# Patient Record
Sex: Female | Born: 1955 | Race: White | Hispanic: No | Marital: Single | Smoking: Never smoker
Health system: Southern US, Community
[De-identification: ages and names within clinical notes are randomized; demographics above are authoritative.]

## PROBLEM LIST (undated history)

## (undated) DIAGNOSIS — E785 Hyperlipidemia, unspecified: Secondary | ICD-10-CM

## (undated) DIAGNOSIS — M0579 Rheumatoid arthritis with rheumatoid factor of multiple sites without organ or systems involvement: Secondary | ICD-10-CM

## (undated) DIAGNOSIS — F419 Anxiety disorder, unspecified: Secondary | ICD-10-CM

## (undated) DIAGNOSIS — M8580 Other specified disorders of bone density and structure, unspecified site: Secondary | ICD-10-CM

## (undated) DIAGNOSIS — M169 Osteoarthritis of hip, unspecified: Secondary | ICD-10-CM

## (undated) HISTORY — DX: Anxiety disorder, unspecified: F41.9

## (undated) HISTORY — DX: Rheumatoid arthritis with rheumatoid factor of multiple sites without organ or systems involvement: M05.79

## (undated) HISTORY — DX: Osteoarthritis of hip, unspecified: M16.9

## (undated) HISTORY — DX: Other specified disorders of bone density and structure, unspecified site: M85.80

## (undated) HISTORY — DX: Hyperlipidemia, unspecified: E78.5

---

## 2010-06-24 LAB — HM COLONOSCOPY

## 2015-09-05 LAB — HM PAP SMEAR: HM Pap smear: NEGATIVE

## 2016-03-27 ENCOUNTER — Ambulatory Visit (INDEPENDENT_AMBULATORY_CARE_PROVIDER_SITE_OTHER): Payer: BLUE CROSS/BLUE SHIELD | Admitting: Physical Therapy

## 2016-03-27 ENCOUNTER — Encounter: Payer: Self-pay | Admitting: Physical Therapy

## 2016-03-27 DIAGNOSIS — R252 Cramp and spasm: Secondary | ICD-10-CM

## 2016-03-27 DIAGNOSIS — M6281 Muscle weakness (generalized): Secondary | ICD-10-CM | POA: Diagnosis not present

## 2016-03-27 DIAGNOSIS — M25551 Pain in right hip: Secondary | ICD-10-CM

## 2016-03-27 NOTE — Therapy (Signed)
[facility] [place] [address] [place], [place], [address] Phone: [phone]   Fax:  [phone]  Physical Therapy Evaluation  Patient Details  Name: Stacy Reed MRN: [id number] Date of Birth: 10/25/1956 Referring Provider: Dr Tobin Chad  Encounter Date: 03/27/2016      PT End of Session - 03/27/16 1454    Visit Number 1   Number of Visits 8   Date for PT Re-Evaluation 04/24/16   PT Start Time 1411   PT Stop Time 1453   PT Time Calculation (min) 42 min   Activity Tolerance Patient tolerated treatment well      History reviewed. No pertinent past medical history.  History reviewed. No pertinent past surgical history.  There were no vitals filed for this visit.       Subjective Assessment - 03/27/16 1410    Subjective Eveny reports gradual onset of Rt hip issues, over the last 3 yrs she has difficulty bearing weight on her Rt LE after sitting for longer than 5', will also have the same issue after sleeping. Pain goes away after about 6 steps. Has tried stretching every morning for about 3 months without releases, also tried massage.    Pertinent History horse knock her down and fell onto her sacrum about 4 yrs ago, other head injuries with horses.    How long can you walk comfortably? feels pain with walking and pushes through it.    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 Yes   Pain Score 3    Pain Location Hip   Pain Orientation Right   Pain Descriptors / Indicators Aching  deep   Pain Type Chronic pain   Pain Radiating Towards Rt groin and gluts   Pain Onset More than a month ago   Pain Frequency Constant   Aggravating Factors  lifting leg onto horse, stand up    Pain Relieving Factors nothing in particular            [facility] PT  Assessment - 03/27/16 0001    Assessment   Medical Diagnosis Rt ITB syndrome   Referring Provider Dr Tobin Chad   Onset Date/Surgical Date 03/27/13   Hand Dominance Right   Next MD Visit not scheduled    Prior Therapy none   Precautions   Precautions None   Balance Screen   Has the patient fallen in the past 6 months No   Has the patient had a decrease in activity level because of a fear of falling?  No   Is the patient reluctant to leave their home because of a fear of falling?  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    Leisure horses, hiking   Observation/Other Assessments   Focus on Therapeutic Outcomes (FOTO)  42% limited   Functional Tests   Functional tests Squat;Single leg stance   Squat   Comments WNL   Single Leg Stance   Comments WNL   Posture/Postural Control   Posture/Postural Control No significant limitations   ROM / Strength  AROM / PROM / Strength AROM;Strength;PROM   AROM   AROM Assessment Site Hip;Lumbar   Right/Left Hip --   Lumbar Flexion 8" from the floor   Lumbar Extension WNL   Lumbar - Right Side Bend WNL   Lumbar - Left Side Bend WNL   Lumbar - Right Rotation WNL   Lumbar - Left Rotation decreased 25%    PROM   PROM Assessment Site Hip   Right/Left Hip Left;Right   Left Hip Extension 10  Rt 6   Strength   Strength Assessment Site Hip;Knee;Ankle;Lumbar   Right/Left Hip Left;Right   Right Hip Flexion 5/5   Right Hip Extension 4/5   Right Hip ABduction 4/5   Left Hip Flexion 5/5   Left Hip Extension 5/5   Left Hip ABduction --   Left Hip ADduction --  5-/5   Right/Left Knee --  WNL except Rt hamstring 4+/5   Right/Left Ankle --  bilat WNL   Lumbar Flexion --  TA good   Lumbar Extension --   multifidi Lt good, Rt fair and delayed   Flexibility   Soft Tissue Assessment /Muscle Length yes   Hamstrings WNL   ITB tight bilat Rt more than Lt   Piriformis tight bilat   Palpation   Spinal mobility hypomobile in lumbar spine CPA and slight UPA Rt   Palpation comment tight and tenderin Rt QL, gluts                   [facility] Adult PT Treatment/Exercise - 03/27/16 0001    Exercises   Exercises Knee/Hip   Knee/Hip Exercises: Stretches   ITB Stretch Both;2 reps;30 seconds  cross body stretch with strap   Other Knee/Hip Stretches quadraped cat/cow x 10                 PT Education - 03/27/16 1510    Education provided Yes   Education Details HEP and TDN   Person(s) Educated Patient   Methods Explanation;Demonstration;Handout   Comprehension Returned demonstration;Verbalized understanding             PT Long Term Goals - 03/27/16 1401    PT LONG TERM GOAL #1   Title I with HEP (04/24/16)    Time 4   Period Weeks   Status New   PT LONG TERM GOAL #2   Title report pain reduction =/> 75% with daily actiivity ( 04/24/16)   Time 4   Period Weeks   Status New   PT LONG TERM GOAL #3   Title improve FOTO =/< 32% limited ( 04/24/16)    Time 4   Period Weeks   Status New   PT LONG TERM GOAL #4   Title demo rt hip extension WNL ( 04/24/16)    Time 4   Period Weeks   Status New   PT LONG TERM GOAL #5   Title demo strong and equal multifidi contraction ( 04/24/16)    Time 4   Period Weeks   Status New               Plan - 03/27/16 1510    Clinical Impression Statement 60 yo female with long h/o Rt hip and low back pain.  She has multiple tight muscle in the Rt lower quadrant and Lt upper lumbar paraspinals. She also has some core and Rt hip weakness.    Rehab Potential Excellent   PT Frequency 2x / week   PT Duration 4  weeks   PT Treatment/Interventions Ultrasound;Neuromuscular re-education;Patient/family education;Dry  needling;Cryotherapy;Electrical Stimulation;Moist Heat;Therapeutic exercise;Manual techniques;Taping   PT Next Visit Plan TDN if pt agreeable, progress flexibility and core strength - pelvic press   Consulted and Agree with Plan of Care Patient      Patient will benefit from skilled therapeutic intervention in order to improve the following deficits and impairments:  Decreased strength, Pain, Hypomobility, Increased muscle spasms, Decreased range of motion  Visit Diagnosis: Pain in right hip - Plan: PT plan of care cert/re-cert  Muscle weakness (generalized) - Plan: PT plan of care cert/re-cert  Cramp and spasm - Plan: PT plan of care cert/re-cert     Problem List There are no active problems to display for this patient.   Roderic Scarce PT 03/27/2016, 5:02 PM  [facility] [address] [place], [place], [address] Phone: [phone]   Fax:  [phone]  Name: Lurleen Soltero MRN: [id number] Date of Birth: 07/31/1956

## 2016-03-27 NOTE — Patient Instructions (Signed)
Angry Cat, All Fours    Kneel on hands and knees. Tuck chin and tighten stomach. Exhale and round back upward. Inhale and arch back downward. Hold each position _1-2__ seconds. Repeat _10__ times per session. Do _2-3__ sessions per day.   Outer Hip Stretch: Reclined IT Band Stretch (Strap)    Strap around opposite foot, pull across only as far as possible with shoulders on mat. Hold for _30-45___ secs. Repeat __2__ times each leg. Complete 2-3 times a day.   Copyright  VHI. All rights reserved.   Trigger Point Dry Needling  . What is Trigger Point Dry Needling (DN)? o DN is a physical therapy technique used to treat muscle pain and dysfunction. Specifically, DN helps deactivate muscle trigger points (muscle knots).  o A thin filiform needle is used to penetrate the skin and stimulate the underlying trigger point. The goal is for a local twitch response (LTR) to occur and for the trigger point to relax. No medication of any kind is injected during the procedure.   . What Does Trigger Point Dry Needling Feel Like?  o The procedure feels different for each individual patient. Some patients report that they do not actually feel the needle enter the skin and overall the process is not painful. Very mild bleeding may occur. However, many patients feel a deep cramping in the muscle in which the needle was inserted. This is the local twitch response.   Marland Kitchen. How Will I feel after the treatment? o Soreness is normal, and the onset of soreness may not occur for a few hours. Typically this soreness does not last longer than two days.  o Bruising is uncommon, however; ice can be used to decrease any possible bruising.  o In rare cases feeling tired or nauseous after the treatment is normal. In addition, your symptoms may get worse before they get better, this period will typically not last longer than 24 hours.   . What Can I do After My Treatment? o Increase your hydration by drinking more water for  the next 24 hours. o You may place ice or heat on the areas treated that have become sore, however, do not use heat on inflamed or bruised areas. Heat often brings more relief post needling. o You can continue your regular activities, but vigorous activity is not recommended initially after the treatment for 24 hours. o DN is best combined with other physical therapy such as strengthening, stretching, and other therapies.

## 2016-03-31 ENCOUNTER — Ambulatory Visit (INDEPENDENT_AMBULATORY_CARE_PROVIDER_SITE_OTHER): Payer: BLUE CROSS/BLUE SHIELD | Admitting: Physical Therapy

## 2016-03-31 ENCOUNTER — Encounter: Payer: Self-pay | Admitting: Physical Therapy

## 2016-03-31 DIAGNOSIS — M6281 Muscle weakness (generalized): Secondary | ICD-10-CM

## 2016-03-31 DIAGNOSIS — M25551 Pain in right hip: Secondary | ICD-10-CM | POA: Diagnosis not present

## 2016-03-31 DIAGNOSIS — R252 Cramp and spasm: Secondary | ICD-10-CM | POA: Diagnosis not present

## 2016-03-31 NOTE — Patient Instructions (Signed)
Pelvic Press     Place hands under belly between navel and pubic bone, palms up. Feel pressure on hands. Increase pressure on hands by pressing pelvis down. This is NOT a pelvic tilt. Hold __5_ seconds. Relax. Repeat _10__ times. Once a day.  KNEE: Flexion - Prone - can place hands under hip bones.    Hold pelvic press. Bend knee, then return the foot down. Repeat on opposite leg. Do not raise hips. _10__ reps per set. When this is mastered, pull both heels up at same time, x 10 reps.  Once a day   Leg Lift: One-Leg   Press pelvis down. Keep knee straight; lengthen and lift one leg (from waist). Do not twist body. Keep other leg down. Hold _1__ seconds. Relax. Repeat 10 time. Repeat with other leg. Perform once a day  HIP: Extension / KNEE: Flexion - Prone - can have hands under hips    Hold pelvic press. Bend knee, squeeze glutes. Raise leg up  10___ reps per set, _1__ sets per day, _1__ time a day. Repeat on the other side  Axial Extension- Upper body sequence * always start with pelvic press    Lie on stomach with forehead resting on floor and arms at sides. Tuck chin in and raise head from floor without bending it up or down. Repeat ___10_ times per set. Do __1__ sets per session. Do _1___ sessions per day.  Progression:  Arms at side Arms in T shape Arms in W shape  Arms in M shape Arms in Y shape  [facility] [place] [address] [place], [place] [address]  [phone] (office) [phone] (fax)

## 2016-03-31 NOTE — Therapy (Signed)
[place], [place], [address] Phone: [phone]   Fax:  [phone]  Physical Therapy Treatment  Patient Details  Name: Stacy Reed MRN: [id number] Date of Birth: Apr 16, 1956 Referring Provider: Dr Ruel Favors  Encounter Date: 03/31/2016      PT End of Session - 03/31/16 1100    Visit Number 2   Number of Visits 8   Date for PT Re-Evaluation 04/24/16   PT Start Time 1101   PT Stop Time 1157   PT Time Calculation (min) 56 min   Activity Tolerance Patient tolerated treatment well      History reviewed. No pertinent past medical history.  History reviewed. No pertinent past surgical history.  There were no vitals filed for this visit.      Subjective Assessment - 03/31/16 1101    Subjective She is doing her HEP and having some relief immediately after stretching, then she feels like her body draws right back up.    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 Be active in her 66's and 80's   Currently in Pain? Yes   Pain Score 2    Pain Location Hip   Pain Orientation Right   Pain Descriptors / Indicators Aching   Pain Type Chronic pain   Pain Onset More than a month ago   Pain Frequency Constant   Aggravating Factors  worse with first getting up   Pain Relieving Factors nothing in particular            [facility] PT Assessment - 03/31/16 0001    Assessment   Medical Diagnosis Rt ITB syndrome   Referring Provider Dr Ruel Favors   Onset Date/Surgical Date 03/27/13   Hand Dominance Right   Next MD Visit not scheduled    Prior Therapy none                     [facility] Adult PT Treatment/Exercise - 03/31/16 0001    Exercises   Exercises Lumbar   Lumbar Exercises: Stretches   Double Knee to Chest Stretch 30 seconds  childs pose   Lower Trunk Rotation 5 reps   Quadruped Mid Back Stretch 5 reps   cat/cow   Lumbar Exercises: Prone   Other Prone Lumbar Exercises pelvic press routine, lower body portion   Modalities   Modalities Electrical Stimulation;Moist Heat   Moist Heat Therapy   Number Minutes Moist Heat 15 Minutes   Moist Heat Location Lumbar Spine;Hip  Rt side   Electrical Stimulation   Electrical Stimulation Location Rt low back and hip   Electrical Stimulation Action IFC    Electrical Stimulation Parameters to tolerance   Electrical Stimulation Goals Pain;Tone   Manual Therapy   Manual Therapy Soft tissue mobilization   Soft tissue mobilization Rt lumbar and buttocks, TPR           Trigger Point Dry Needling - 03/31/16 1112    Consent Given? Yes   Education Handout Provided Yes   Muscles Treated Upper Body Quadratus Lumborum;Longissimus  Rt side   Longissimus Response Twitch response elicited;Palpable increased muscle length              PT Education - 03/31/16 1108    Education provided Yes   Education Details pelvic press series, lower body only   Person(s) Educated Patient   Methods Explanation;Demonstration;Handout   Comprehension Returned demonstration;Verbalized understanding  PT Long Term Goals - 03/31/16 1110    PT LONG TERM GOAL #1   Title I with HEP (04/24/16)    Time 4   Period Weeks   Status On-going   PT LONG TERM GOAL #2   Title report pain reduction =/> 75% with daily actiivity ( 04/24/16)   Time 4   Period Weeks   Status On-going   PT LONG TERM GOAL #3   Title improve FOTO =/< 32% limited ( 04/24/16)    Time 4   Period Weeks   Status On-going   PT LONG TERM GOAL #4   Title demo rt hip extension WNL ( 04/24/16)    Time 4   Period Weeks   Status On-going   PT LONG TERM GOAL #5   Title demo strong and equal multifidi contraction ( 04/24/16)    Time 4   Period Weeks   Status On-going               Plan - 03/31/16 1144    Clinical Impression Statement This is Stacy Reed's first full treatment.  She  responded well to TDN with increased ROM. She is still very tight in the Rt lower quadrant.  She also fatigued with strengthening of her multifidi lumbar.  No goals met at this time.    Rehab Potential Excellent   PT Frequency 2x / week   PT Duration 4 weeks   PT Treatment/Interventions Ultrasound;Neuromuscular re-education;Patient/family education;Dry needling;Cryotherapy;Electrical Stimulation;Moist Heat;Therapeutic exercise;Manual techniques;Taping   PT Next Visit Plan assess response to TDN   Consulted and Agree with Plan of Care Patient      Patient will benefit from skilled therapeutic intervention in order to improve the following deficits and impairments:  Decreased strength, Pain, Hypomobility, Increased muscle spasms, Decreased range of motion  Visit Diagnosis: Muscle weakness (generalized)  Cramp and spasm  Pain in right hip     Problem List There are no active problems to display for this patient.   Jeral Pinch PT 03/31/2016, 11:45 AM  [facility] [place], [place], [address] Phone: [phone]   Fax:  [phone]  Name: Stacy Reed MRN: [id number] Date of Birth: [birth year]

## 2016-04-04 ENCOUNTER — Ambulatory Visit (INDEPENDENT_AMBULATORY_CARE_PROVIDER_SITE_OTHER): Payer: BLUE CROSS/BLUE SHIELD | Admitting: Physical Therapy

## 2016-04-04 DIAGNOSIS — M25551 Pain in right hip: Secondary | ICD-10-CM

## 2016-04-04 DIAGNOSIS — R252 Cramp and spasm: Secondary | ICD-10-CM

## 2016-04-04 DIAGNOSIS — M6281 Muscle weakness (generalized): Secondary | ICD-10-CM

## 2016-04-04 NOTE — Therapy (Signed)
[facility] [place] [address] [place], [place], [address] Phone: [phone]   Fax:  [phone]  Physical Therapy Treatment  Patient Details  Name: Stacy Reed MRN: [id number] Date of Birth: 06-Apr-1956 Referring Provider: Dr. Tobin Chad  Encounter Date: 04/04/2016      PT End of Session - 04/04/16 0936    Visit Number 3   Number of Visits 8   Date for PT Re-Evaluation 04/24/16   PT Start Time 0935   PT Stop Time 1028   PT Time Calculation (min) 53 min   Activity Tolerance Patient tolerated treatment well      No past medical history on file.  No past surgical history on file.  There were no vitals filed for this visit.      Subjective Assessment - 04/04/16 0936    Subjective Pt felt a change since TDN "a release". Pt feels like she is moving in the right direction. Pt reports she feels fear of getting hurt if she rides a horse, but that fear is disappating.    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 Yes   Pain Score 4    Pain Location Back   Pain Orientation Left   Pain Descriptors / Indicators Dull;Aching   Aggravating Factors  non-movement (sleeping all night)   Pain Relieving Factors stretches.             [facility] PT Assessment - 04/04/16 0001    Assessment   Medical Diagnosis Rt ITB syndrome   Referring Provider Dr. Tobin Chad   Onset Date/Surgical Date 03/27/13   Hand Dominance Right   Next MD Visit not scheduled    Prior Therapy none         [facility] Adult PT Treatment/Exercise - 04/04/16 0001    Lumbar Exercises: Prone   Other Prone Lumbar Exercises pelvic press with bilat knee flexion x 10 reps;  pelvic press with shoulder ext x 5 reps, then with T's x 5 reps, Y's x 5 reps     Knee/Hip Exercises: Stretches   Passive Hamstring Stretch Right;Left;2 reps;30 seconds    ITB Stretch Both;2 reps;30 seconds  cross body stretch with strap   Piriformis Stretch Right;Left;2 reps;30 seconds   Knee/Hip Exercises: Aerobic   Nustep L5: 5 min    Knee/Hip Exercises: Standing   Other Standing Knee Exercises Modified childs pose in standing x 30 sec x 3 reps    Moist Heat Therapy   Number Minutes Moist Heat 15 Minutes   Moist Heat Location Lumbar Spine;Hip   Electrical Stimulation   Electrical Stimulation Location lumbar paraspinals    Electrical Stimulation Action IFC    Electrical Stimulation Parameters to tolerance    Electrical Stimulation Goals Pain;Tone   Manual Therapy   Manual Therapy Soft tissue mobilization   Manual therapy comments Pt educated on how to perform self massage to ant / lateral hip (during manual therapy session); pt verbalized understanding.    Soft tissue mobilization Rt/Lt iliopsoas                      PT Long Term Goals - 03/31/16 1110    PT LONG TERM GOAL #1   Title I with HEP (04/24/16)    Time 4   Period Weeks   Status On-going   PT LONG TERM GOAL #  2   Title report pain reduction =/> 75% with daily actiivity ( 04/24/16)   Time 4   Period Weeks   Status On-going   PT LONG TERM GOAL #3   Title improve FOTO =/< 32% limited ( 04/24/16)    Time 4   Period Weeks   Status On-going   PT LONG TERM GOAL #4   Title demo rt hip extension WNL ( 04/24/16)    Time 4   Period Weeks   Status On-going   PT LONG TERM GOAL #5   Title demo strong and equal multifidi contraction ( 04/24/16)    Time 4   Period Weeks   Status On-going               Plan - 04/04/16 1025    Clinical Impression Statement Pt tolerated all exercises well, without increase in pain in hip / low back.  Mutifidi contraction improving. Pt was very point tender in bilat iliopsoas with manual therapy, slow to release.  Progressing towards goals.    Rehab Potential Excellent   PT Frequency 2x / week   PT Duration 4 weeks   PT Treatment/Interventions  Ultrasound;Neuromuscular re-education;Patient/family education;Dry needling;Cryotherapy;Electrical Stimulation;Moist Heat;Therapeutic exercise;Manual techniques;Taping   PT Next Visit Plan manual therapy to low back/ psoas/ hip.  review upper body strengthening with pelvic press   Consulted and Agree with Plan of Care Patient      Patient will benefit from skilled therapeutic intervention in order to improve the following deficits and impairments:  Decreased strength, Pain, Hypomobility, Increased muscle spasms, Decreased range of motion  Visit Diagnosis: Muscle weakness (generalized)  Cramp and spasm  Pain in right hip     Problem List There are no active problems to display for this patient.  Mayer CamelJennifer Carlson-Long, PTA 04/04/2016 11:13 AM  [facility] [address] [place], [place], [address] Phone: [phone]   Fax:  [phone]  Name: Stacy Reed MRN: [id number] Date of Birth: 1955/12/16

## 2016-04-07 ENCOUNTER — Encounter: Payer: Self-pay | Admitting: Rehabilitative and Restorative Service Providers"

## 2016-04-07 ENCOUNTER — Ambulatory Visit (INDEPENDENT_AMBULATORY_CARE_PROVIDER_SITE_OTHER): Payer: BLUE CROSS/BLUE SHIELD | Admitting: Rehabilitative and Restorative Service Providers"

## 2016-04-07 DIAGNOSIS — M6281 Muscle weakness (generalized): Secondary | ICD-10-CM

## 2016-04-07 DIAGNOSIS — M25551 Pain in right hip: Secondary | ICD-10-CM | POA: Diagnosis not present

## 2016-04-07 DIAGNOSIS — R252 Cramp and spasm: Secondary | ICD-10-CM

## 2016-04-07 NOTE — Therapy (Addendum)
[facility] [address] [place], [place], [address] Phone: [phone]   Fax:  [phone]  Physical Therapy Treatment  Patient Details  Name: Stacy Reed MRN: [id number] Date of Birth: [birth year] Referring Provider: Dr. Tobin ChadS lucey  Encounter Date: 04/07/2016      PT End of Session - 04/07/16 0939    Visit Number 4   Number of Visits 8   Date for PT Re-Evaluation 04/24/16   PT Start Time 0936   PT Stop Time 1030   PT Time Calculation (min) 54 min   Activity Tolerance Patient tolerated treatment well      History reviewed. No pertinent past medical history.  History reviewed. No pertinent past surgical history.  There were no vitals filed for this visit.      Subjective Assessment - 04/07/16 0940    Subjective Mpore sore across her back - dull ache in LB - feeling discomfort in the Rt sciatic area. Symptoms have improved in the hip but seem to be increased in the LB. On her feet during a very long day Saturday. Did not have time to do all the reps of all the exercises - did the exercises.    Currently in Pain? Yes   Pain Score 4    Pain Location Back   Pain Orientation Left   Pain Descriptors / Indicators Dull;Aching   Pain Type Chronic pain   Pain Onset More than a month ago   Pain Frequency Constant            [facility] PT Assessment - 04/07/16 0001    Assessment   Medical Diagnosis Rt ITB syndrome   Referring Provider Dr. Tobin ChadS lucey   Onset Date/Surgical Date 03/27/13   Hand Dominance Right   Next MD Visit not scheduled    Prior Therapy none   Strength   Right Hip Extension 4+/5   Right Hip ABduction 4+/5   Flexibility   ITB tight bilat Rt more than Lt   Piriformis tight bilat   Palpation   Spinal mobility hypomobile in lumbar spine CPA and slight UPA Rt   Palpation comment tight and tender in Rt QL, gluts; piriformis                     [facility] Adult PT Treatment/Exercise - 04/07/16  0001    Lumbar Exercises: Stretches   Quadruped Mid Back Stretch 5 reps  cat cow   ITB Stretch 3 reps;30 seconds   Knee/Hip Exercises: Stretches   Passive Hamstring Stretch Right;Left;2 reps;30 seconds   Hip Flexor Stretch 3 reps;30 seconds  Rt - supine with Rt LE off edge of bed    ITB Stretch Both;2 reps;30 seconds  cross body stretch with strap   Piriformis Stretch Right;Left;2 reps;30 seconds   Knee/Hip Exercises: Aerobic   Nustep L5: 5 min    Moist Heat Therapy   Number Minutes Moist Heat 15 Minutes   Moist Heat Location Lumbar Spine;Hip   Electrical Stimulation   Electrical Stimulation Location lumbar paraspinals    Electrical Stimulation Action IFC  estim w/ TDN bilat lumbar paraspinals    Electrical Stimulation Parameters to tolerance   Electrical Stimulation Goals Pain;Tone   Manual Therapy   Manual Therapy Soft tissue mobilization   Manual therapy comments Pt educated on how to perform self massage to ant / lateral hip (during manual therapy session); pt verbalized understanding.    Soft tissue mobilization Rt/Lt iliopsoas  Trigger Point Dry Needling - 04/07/16 1000    Consent Given? Yes   Longissimus Response Twitch response elicited;Palpable increased muscle length   Gluteus Minimus Response Twitch response elicited;Palpable increased muscle length                   PT Long Term Goals - 03/31/16 1110    PT LONG TERM GOAL #1   Title I with HEP (04/24/16)    Time 4   Period Weeks   Status On-going   PT LONG TERM GOAL #2   Title report pain reduction =/> 75% with daily actiivity ( 04/24/16)   Time 4   Period Weeks   Status On-going   PT LONG TERM GOAL #3   Title improve FOTO =/< 32% limited ( 04/24/16)    Time 4   Period Weeks   Status On-going   PT LONG TERM GOAL #4   Title demo rt hip extension WNL ( 04/24/16)    Time 4   Period Weeks   Status On-going   PT LONG TERM GOAL #5   Title demo strong and equal multifidi contraction (  04/24/16)    Time 4   Period Weeks   Status On-going               Plan - 04/07/16 1025    Clinical Impression Statement Hip improved per pt report. Increase in hip strength. Continued tenderness and tightness to palpation. Responded with decrease in tightness and tenderness through lumbar spine mobs and improved tissue extensibility following TDN with estims. Working on [employer]. Progressing gradually toward stated goals.    Rehab Potential Excellent   PT Frequency 2x / week   PT Duration 4 weeks   PT Treatment/Interventions Ultrasound;Neuromuscular re-education;Patient/family education;Dry needling;Cryotherapy;Electrical Stimulation;Moist Heat;Therapeutic exercise;Manual techniques;Taping   PT Next Visit Plan manual therapy to low back/ psoas/ hip.  review upper body strengthening with pelvic press; assess response to TDN with estim   Consulted and Agree with Plan of Care Patient      Patient will benefit from skilled therapeutic intervention in order to improve the following deficits and impairments:  Decreased strength, Pain, Hypomobility, Increased muscle spasms, Decreased range of motion  Visit Diagnosis: Muscle weakness (generalized)  Cramp and spasm  Pain in right hip     Problem List There are no active problems to display for this patient.   Andrik Sandt Rober Minion PT, MPH  04/07/2016, 11:51 AM  [facility] [address] [place], [place], [address] Phone: [phone]   Fax:  [phone]  Name: Stacy Reed MRN: [id number] Date of Birth: Feb 07, 1956

## 2016-04-10 ENCOUNTER — Encounter: Payer: BLUE CROSS/BLUE SHIELD | Admitting: Physical Therapy

## 2016-04-11 ENCOUNTER — Ambulatory Visit (INDEPENDENT_AMBULATORY_CARE_PROVIDER_SITE_OTHER): Payer: BLUE CROSS/BLUE SHIELD | Admitting: Physical Therapy

## 2016-04-11 DIAGNOSIS — R252 Cramp and spasm: Secondary | ICD-10-CM

## 2016-04-11 DIAGNOSIS — M25551 Pain in right hip: Secondary | ICD-10-CM

## 2016-04-11 DIAGNOSIS — M6281 Muscle weakness (generalized): Secondary | ICD-10-CM | POA: Diagnosis not present

## 2016-04-11 NOTE — Therapy (Signed)
[facility] [address] [place], [place], [address] Phone: [phone]   Fax:  [phone]  Physical Therapy Treatment  Patient Details  Name: Stacy Reed MRN: [id number] Date of Birth: 16-Apr-1956 Referring Provider: Dr. Sherlean FootLucey  Encounter Date: 04/11/2016      PT End of Session - 04/11/16 1337    Visit Number 5   Number of Visits 8   PT Start Time 1335   PT Stop Time 1440   PT Time Calculation (min) 65 min   Activity Tolerance Patient tolerated treatment well      No past medical history on file.  No past surgical history on file.  There were no vitals filed for this visit.      Subjective Assessment - 04/11/16 1337    Subjective Pt reports she was more sore after TDN, but 2 days later felt better.  Pt went riding for first time in 6 months (walking/trotting/canter) - riding felt ok, but mounting and dismounting was painful.    Currently in Pain? Yes   Pain Score 3    Pain Location Hip   Pain Orientation Right   Pain Descriptors / Indicators Dull;Aching   Pain Radiating Towards Rt glute   Aggravating Factors  non-movement    Pain Relieving Factors stretches, massage,             [facility] PT Assessment - 04/11/16 0001    Assessment   Medical Diagnosis Rt ITB syndrome   Referring Provider Dr. Sherlean FootLucey   Onset Date/Surgical Date 03/27/13   Hand Dominance Right   Next MD Visit not scheduled          [facility] Adult PT Treatment/Exercise - 04/11/16 0001    Lumbar Exercises: Stretches   ITB Stretch, Hamstring stretch  2 reps;30 seconds each leg   Piriformis Stretch Hip flexor stretch  30 seconds;3 reps each leg Rt side 30 sec   Lumbar Exercises: Sidelying   Other Sidelying Lumbar Exercises sidelying over bolster on Lt side x 2 min .    Lumbar Exercises: Quadruped   Hip extension - on Green therapy ball  10 reps  Each leg    Fire hydrants  Quadruped: Location managerfire hydrants    Knee/Hip Exercises: Aerobic   Nustep  L5: 5 min    Knee/Hip Exercises: Standing   SLS Rt/Lt SLS with forward leans to chair  5 times each leg, challenging.    Moist Heat Therapy   Number Minutes Moist Heat 15 Minutes   Moist Heat Location Lumbar Spine;Hip   Electrical Stimulation   Electrical Stimulation Location Rt lumbar paraspinals, Rt SI and glute.    Electrical Stimulation Action premod to each area   Electrical Stimulation Parameters to tolerance    Electrical Stimulation Goals Pain;Tone   Manual Therapy   Manual Therapy Soft tissue mobilization;Myofascial release;Taping   Manual therapy comments Pt educated on how to perform self massage to ant / lateral hip (during manual therapy session); pt verbalized understanding.   Rock tape applied to Rt QL, Rt piriformis and Glute med to decompress tissue, decrease pain and increase proprioception.    Soft tissue mobilization TPR to Rt iliopsoas, Rt lumbar paraspinals, QL, piriformis. Pelvic rock with deept pressure to rotators.     Myofascial Release to QL on Rt          PT Long Term Goals - 03/31/16 1110    PT LONG TERM GOAL #1   Title I with HEP (04/24/16)  Time 4   Period Weeks   Status On-going   PT LONG TERM GOAL #2   Title report pain reduction =/> 75% with daily actiivity ( 04/24/16)   Time 4   Period Weeks   Status On-going   PT LONG TERM GOAL #3   Title improve FOTO =/< 32% limited ( 04/24/16)    Time 4   Period Weeks   Status On-going   PT LONG TERM GOAL #4   Title demo rt hip extension WNL ( 04/24/16)    Time 4   Period Weeks   Status On-going   PT LONG TERM GOAL #5   Title demo strong and equal multifidi contraction ( 04/24/16)    Time 4   Period Weeks   Status On-going               Plan - 04/11/16 1453    Clinical Impression Statement Pt tender with manual work to Rt lower thoracic and lumbar paraspinals, as well as Rt psoas; slow to release.  Pt reported decreased stiffness/ pain with ther ex and further reduction with use of estim/ MHP to  area at end of session.  Trial of Rock tape applied to Rt glute for pain relief.    Rehab Potential Excellent   PT Frequency 2x / week   PT Duration 4 weeks   PT Treatment/Interventions Ultrasound;Neuromuscular re-education;Patient/family education;Dry needling;Cryotherapy;Electrical Stimulation;Moist Heat;Therapeutic exercise;Manual techniques;Taping   PT Next Visit Plan manual therapy to low back/ psoas/ hip. Continue progressive hip strengthening / stretching ther exercise as tolerated.    Consulted and Agree with Plan of Care Patient      Patient will benefit from skilled therapeutic intervention in order to improve the following deficits and impairments:  Decreased strength, Pain, Hypomobility, Increased muscle spasms, Decreased range of motion  Visit Diagnosis: Muscle weakness (generalized)  Cramp and spasm  Pain in right hip     Problem List There are no active problems to display for this patient.  Mayer Camel, PTA 04/11/2016 3:02 PM   [facility] [place] [address] [place], [place], [address] Phone: [phone]   Fax:  [phone]  Name: Stacy Reed MRN: [id number] Date of Birth: 01-Mar-1956

## 2016-04-14 ENCOUNTER — Ambulatory Visit (INDEPENDENT_AMBULATORY_CARE_PROVIDER_SITE_OTHER): Payer: BLUE CROSS/BLUE SHIELD | Admitting: Rehabilitative and Restorative Service Providers"

## 2016-04-14 ENCOUNTER — Encounter: Payer: Self-pay | Admitting: Rehabilitative and Restorative Service Providers"

## 2016-04-14 DIAGNOSIS — R252 Cramp and spasm: Secondary | ICD-10-CM

## 2016-04-14 DIAGNOSIS — M6281 Muscle weakness (generalized): Secondary | ICD-10-CM

## 2016-04-14 DIAGNOSIS — M25551 Pain in right hip: Secondary | ICD-10-CM

## 2016-04-14 NOTE — Therapy (Signed)
[facility] [place] [address] [place], [place], [address] Phone: [phone]   Fax:  [phone]  Physical Therapy Treatment  Patient Details  Name: Stacy Reed MRN: [id number] Date of Birth: 01/16/1956 Referring Provider: Dr. Sherlean Foot  Encounter Date: 04/14/2016      PT End of Session - 04/14/16 1441    Visit Number 6   Number of Visits 8   Date for PT Re-Evaluation 04/24/16   PT Start Time 1434   PT Stop Time 1527   PT Time Calculation (min) 53 min   Activity Tolerance Patient tolerated treatment well      History reviewed. No pertinent past medical history.  History reviewed. No pertinent past surgical history.  There were no vitals filed for this visit.      Subjective Assessment - 04/14/16 1438    Subjective Flare up in the past few days - not sure why. She was sore from riding but not terrible. She was on her feet for about 12 hours Saturday but that is not that unsual. Notices that she has a "grabby" sensation in the pelvis with movement.  could not tell a difference with the tape one way or another. Took some off b/c it was bewginning to roll down and come off.    Currently in Pain? Yes   Pain Score 5    Pain Location Hip   Pain Orientation Right   Pain Radiating Towards low back - into the deep hip/buttock area on the Rt - Rt neck hurts as well    Pain Onset More than a month ago   Pain Frequency Constant                         [facility] Adult PT Treatment/Exercise - 04/14/16 0001    Lumbar Exercises: Stretches   Quadruped Mid Back Stretch 5 reps  cat cow    Quad Stretch 3 reps;30 seconds   ITB Stretch 2 reps;30 seconds   Piriformis Stretch 30 seconds;3 reps   Knee/Hip Exercises: Stretches   Hip Flexor Stretch 3 reps;30 seconds  supine with Rt LE off edge of bed    Moist Heat Therapy   Number Minutes Moist Heat 20 Minutes   Moist Heat Location Lumbar Spine;Hip   Electrical Stimulation    Electrical Stimulation Location Rt lumbar paraspinals, Rt SI and glute.    Electrical Stimulation Action IFC   Electrical Stimulation Parameters to tolerance   Electrical Stimulation Goals Pain;Tone   Manual Therapy   Manual Therapy Soft tissue mobilization;Myofascial release;Taping   Soft tissue mobilization deep tissue work through the Rt thoracic and lumbar paraspinals; piriformis; gluts; QL    Myofascial Release piriformis and hip abductors                      PT Long Term Goals - 04/14/16 1442    PT LONG TERM GOAL #1   Title I with HEP (04/24/16)    Time 4   Period Weeks   Status On-going   PT LONG TERM GOAL #2   Title report pain reduction =/> 75% with daily actiivity ( 04/24/16)   Time 4   Period Weeks   Status On-going   PT LONG TERM GOAL #3   Title improve FOTO =/< 32% limited ( 04/24/16)    Time 4   Period Weeks   Status On-going   PT LONG TERM GOAL #4   Title demo rt  hip extension WNL ( 04/24/16)    Time 4   Period Weeks   Status On-going   PT LONG TERM GOAL #5   Title demo strong and equal multifidi contraction ( 04/24/16)    Time 4   Period Weeks   Status On-going               Plan - 04/14/16 1451    Clinical Impression Statement Flare up of symptoms today - not sure why. Rt side is worse than the Lt. Felt good the rest of the day that she was in for treatment last. She is not sure the tape helped. Has not gotten a ball to work on the hip flexors but plans to do so. No increase in exercise today due to flare up.    Rehab Potential Excellent   PT Frequency 2x / week   PT Treatment/Interventions Ultrasound;Neuromuscular re-education;Patient/family education;Dry needling;Cryotherapy;Electrical Stimulation;Moist Heat;Therapeutic exercise;Manual techniques;Taping   PT Next Visit Plan manual therapy to low back/ psoas/ hip. Continue progressive hip strengthening / stretching ther exercise as tolerated.    Consulted and Agree with Plan of Care  Patient      Patient will benefit from skilled therapeutic intervention in order to improve the following deficits and impairments:  Decreased strength, Pain, Hypomobility, Increased muscle spasms, Decreased range of motion  Visit Diagnosis: Muscle weakness (generalized)  Cramp and spasm  Pain in right hip     Problem List There are no active problems to display for this patient.   Malakhai Beitler Rober MinionP Virgle Arth PT, MPH  04/14/2016, 3:21 PM  [facility] [address] [place], [place], [address] Phone: [phone]   Fax:  [phone]  Name: Stacy Reed MRN: [id number] Date of Birth: 09-Sep-1956

## 2016-04-18 ENCOUNTER — Encounter: Payer: BLUE CROSS/BLUE SHIELD | Admitting: Physical Therapy

## 2016-04-22 ENCOUNTER — Ambulatory Visit (INDEPENDENT_AMBULATORY_CARE_PROVIDER_SITE_OTHER): Payer: BLUE CROSS/BLUE SHIELD | Admitting: Physical Therapy

## 2016-04-22 DIAGNOSIS — M25551 Pain in right hip: Secondary | ICD-10-CM | POA: Diagnosis not present

## 2016-04-22 DIAGNOSIS — M6281 Muscle weakness (generalized): Secondary | ICD-10-CM | POA: Diagnosis not present

## 2016-04-22 DIAGNOSIS — R252 Cramp and spasm: Secondary | ICD-10-CM

## 2016-04-22 NOTE — Therapy (Signed)
[facility] [address] [place], [place], [address] Phone: [phone]   Fax:  [phone]  Physical Therapy Treatment  Patient Details  Name: Stacy Reed MRN: [id number] Date of Birth: Oct 06, 1956 Referring Provider: Dr. Sherlean FootLucey  Encounter Date: 04/22/2016      PT End of Session - 04/22/16 1155    Visit Number 7   Number of Visits 8   Date for PT Re-Evaluation 04/24/16   PT Start Time 1149   PT Stop Time 1251   PT Time Calculation (min) 62 min      No past medical history on file.  No past surgical history on file.  There were no vitals filed for this visit.      Subjective Assessment - 04/22/16 1155    Subjective Pt reports her hip has calmed down a little.  She had a cold last week, felt her body was more sensitive.  Has not riden horse in about a week.    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 Yes   Pain Score 5    Pain Location Hip   Pain Orientation Right   Pain Descriptors / Indicators Aching  stiffness   Aggravating Factors  non-movement (just waking after sleep)   Pain Relieving Factors stretches, massage            [facility] PT Assessment - 04/22/16 0001    Assessment   Medical Diagnosis Rt ITB syndrome   Referring Provider Dr. Sherlean FootLucey   Onset Date/Surgical Date 03/27/13   Hand Dominance Right   Next MD Visit not scheduled    Flexibility   Soft Tissue Assessment /Muscle Length yes   Quadriceps Lt heel 1" from buttock, Rt heel 5" from buttock          [facility] Adult PT Treatment/Exercise - 04/22/16 0001    Lumbar Exercises: Stretches   Quadruped Mid Back Stretch 5 reps  cat cow    Quad Stretch 3 reps;30 seconds   ITB Stretch 2 reps;30 seconds   Piriformis Stretch 30 seconds;3 reps   Knee/Hip Exercises: Stretches   Other Knee/Hip Stretches childs pose  x 2 reps    Knee/Hip Exercises: Aerobic   Nustep L3: 3 min (unable to tolerate any longer)   Moist Heat Therapy   Number Minutes Moist Heat 15 Minutes   Moist Heat Location Lumbar Spine;Hip   Electrical Stimulation   Electrical Stimulation Location bilat lumbar paraspinals and bilat SI joint    Electrical Stimulation Action IFC   Electrical Stimulation Parameters to tolerance    Electrical Stimulation Goals Pain   Manual Therapy   Manual Therapy Soft tissue mobilization;Myofascial release   Soft tissue mobilization deep tissue work through the [employer] /Lt thoracic and lumbar paraspinals; piriformis; gluts; QL    Myofascial Release piriformis and hip abductors            PT Long Term Goals - 04/14/16 1442    PT LONG TERM GOAL #1   Title I with HEP (04/24/16)    Time 4   Period Weeks   Status On-going   PT LONG TERM GOAL #2   Title report pain reduction =/> 75% with daily actiivity ( 04/24/16)   Time 4   Period Weeks   Status On-going   PT LONG TERM GOAL #3   Title improve FOTO =/< 32% limited (  04/24/16)    Time 4   Period Weeks   Status On-going   PT LONG TERM GOAL #4   Title demo rt hip extension WNL ( 04/24/16)    Time 4   Period Weeks   Status On-going   PT LONG TERM GOAL #5   Title demo strong and equal multifidi contraction ( 04/24/16)    Time 4   Period Weeks   Status On-going               Plan - 04/22/16 1240    Clinical Impression Statement Pt's hip has calmed down a little since recent flare.  Pt reported decreased tightness with ther ex and manual therapy and further pain reduction with use of estim/ MHP at end of session.  Pt making very gradual progress.  She just received TENS unit for home use to decrease pain.     Rehab Potential Excellent   PT Frequency 2x / week   PT Duration 4 weeks   PT Treatment/Interventions Ultrasound;Neuromuscular re-education;Patient/family education;Dry needling;Cryotherapy;Electrical Stimulation;Moist Heat;Therapeutic  exercise;Manual techniques;Taping   PT Next Visit Plan Assess progress and goals, POC ending on 04/24/16.  FOTO.    Consulted and Agree with Plan of Care Patient      Patient will benefit from skilled therapeutic intervention in order to improve the following deficits and impairments:  Decreased strength, Pain, Hypomobility, Increased muscle spasms, Decreased range of motion  Visit Diagnosis: Muscle weakness (generalized)  Cramp and spasm  Pain in right hip     Problem List There are no active problems to display for this patient.  Mayer Camel, PTA 04/22/2016 12:55 PM  [facility] [place] [address] [place], [place], [address] Phone: [phone]   Fax:  [phone]  Name: Stacy Reed MRN: [id number] Date of Birth: January 03, 1956

## 2016-05-09 ENCOUNTER — Encounter: Payer: BLUE CROSS/BLUE SHIELD | Admitting: Rehabilitative and Restorative Service Providers"

## 2016-05-12 ENCOUNTER — Encounter: Payer: BLUE CROSS/BLUE SHIELD | Admitting: Rehabilitative and Restorative Service Providers"

## 2016-05-14 ENCOUNTER — Ambulatory Visit (INDEPENDENT_AMBULATORY_CARE_PROVIDER_SITE_OTHER): Payer: BLUE CROSS/BLUE SHIELD | Admitting: Rehabilitative and Restorative Service Providers"

## 2016-05-14 ENCOUNTER — Encounter: Payer: Self-pay | Admitting: Rehabilitative and Restorative Service Providers"

## 2016-05-14 DIAGNOSIS — R252 Cramp and spasm: Secondary | ICD-10-CM

## 2016-05-14 DIAGNOSIS — M25551 Pain in right hip: Secondary | ICD-10-CM

## 2016-05-14 DIAGNOSIS — M6281 Muscle weakness (generalized): Secondary | ICD-10-CM

## 2016-05-14 NOTE — Therapy (Addendum)
[place], [place], [address] Phone: [phone]   Fax:  [phone]  Physical Therapy Treatment  Patient Details  Name: Stacy Reed MRN: [id number] Date of Birth: May 30, 1956 Referring Provider: Dr. Ronnie Derby  Encounter Date: 05/14/2016      PT End of Session - 05/14/16 1606    Visit Number 8   Number of Visits 8   Date for PT Re-Evaluation 04/24/16   PT Start Time 5462   PT Stop Time 1655   PT Time Calculation (min) 48 min   Activity Tolerance Patient tolerated treatment well      History reviewed. No pertinent past medical history.  History reviewed. No pertinent past surgical history.  There were no vitals filed for this visit.      Subjective Assessment - 05/14/16 1610    Subjective On vacation last week and did not do her exercises much at all. Stretches maybe 4 days a week 1 time at day. Noticed that she had less pain when she less physically active. Continues to have major problems with sitting. Does not feel she has made much progress. Thinks she needs to be more consistent with her stretching and would like to try this on her own for a while. She will call  if she decides to schedule additional visits.    Currently in Pain? Yes   Pain Score 5    Pain Location Hip   Pain Orientation Right   Pain Descriptors / Indicators Aching   Pain Type Chronic pain   Pain Onset More than a month ago   Pain Frequency Constant   Aggravating Factors  sitting; walking; stretching initially increases the pain    Pain Relieving Factors stretching; massage             [facility] PT Assessment - 05/14/16 0001    Assessment   Medical Diagnosis Rt ITB syndrome   Referring Provider Dr. Ronnie Derby   Onset Date/Surgical Date 03/27/13   Hand Dominance Right   Next MD Visit not scheduled    Observation/Other Assessments   Focus on Therapeutic Outcomes (FOTO)  405 limitation    Strength   Right Hip Extension  --  5-/5   Right Hip ABduction --  5-/5   Flexibility   Quadriceps Lt heel 1" from buttock, Rt heel 4" from buttock   ITB tight bilat Rt more than Lt   Piriformis tight bilat                     [facility] Adult PT Treatment/Exercise - 05/14/16 0001    Knee/Hip Exercises: Stretches   Hip Flexor Stretch 3 reps;30 seconds  supine with Rt LE off edge of table    Piriformis Stretch 3 reps;30 seconds  travell - varying poistions knee to opp shd x 3 30 sec    Other Knee/Hip Stretches hi pflexor stretch in standing 45 sec x 2 Lt hip at edge of counter Rt hip extended   Other Knee/Hip Stretches 1/2 kneeling with pelvic tilt 30 sec x 2    Moist Heat Therapy   Number Minutes Moist Heat 14 Minutes   Moist Heat Location Lumbar Spine;Hip   Electrical Stimulation   Electrical Stimulation Location Rt lumbar/piriformis/glut/iliopsoas    Electrical Stimulation Action IFC   Electrical Stimulation Parameters to tolerance   Electrical Stimulation Goals Pain;Tone   Manual Therapy   Manual Therapy Soft tissue mobilization;Myofascial release   Soft tissue mobilization deep tissue work  through the Rt piriformis; gluts;iliopsoas    Myofascial Release piriformis and hip abductors                 PT Education - 05/14/16 1653    Education provided Yes   Education Details HEP    Person(s) Educated Patient   Methods Explanation;Demonstration;Tactile cues;Verbal cues;Handout   Comprehension Verbalized understanding;Returned demonstration;Verbal cues required;Tactile cues required             PT Long Term Goals - 05/14/16 1703    PT LONG TERM GOAL #1   Title I with HEP (04/24/16)    Time 4   Period Weeks   Status Achieved   PT LONG TERM GOAL #2   Title report pain reduction =/> 75% with daily actiivity ( 04/24/16)   Time 4   Period Weeks   Status On-going   PT LONG TERM GOAL #3   Title improve FOTO =/< 32% limited ( 04/24/16)    Time 4   Period Weeks   Status On-going   PT  LONG TERM GOAL #4   Title demo rt hip extension WNL ( 04/24/16)    Time 4   Period Weeks   Status On-going   PT LONG TERM GOAL #5   Title demo strong and equal multifidi contraction ( 04/24/16)    Time 4   Period Weeks   Status On-going               Plan - 05/14/16 1659    Clinical Impression Statement Patient demonstrates some continued improvement in the strength and mobility through Rt lower quarter but continues to have muscular tightness and tenderness to palpation through the iliopsoas ; piriformis; glut medius Rt. She has not been consisitent with her HEP or myofacial  ball relase work and wants to hold therapy for now. She will try to be more consistent with her exercises and call to schedule if she feels she wants to return to PT.    Rehab Potential Good   PT Frequency 2x / week   PT Duration 4 weeks   PT Treatment/Interventions Ultrasound;Neuromuscular re-education;Patient/family education;Dry needling;Cryotherapy;Electrical Stimulation;Moist Heat;Therapeutic exercise;Manual techniques;Taping   PT Next Visit Plan Hold PT - patient wants to work on I HEP and will call to schedule if she decides   Consulted and Agree with Plan of Care Patient      Patient will benefit from skilled therapeutic intervention in order to improve the following deficits and impairments:  Decreased strength, Pain, Hypomobility, Increased muscle spasms, Decreased range of motion  Visit Diagnosis: Muscle weakness (generalized)  Cramp and spasm  Pain in right hip     Problem List There are no active problems to display for this patient.   Sheridan Gettel Nilda Simmer PT, MPH  05/14/2016, 5:06 PM  [facility] [place], [place], [address] Phone: [phone]   Fax:  [phone]  Name: Stacy Reed MRN: [id number] Date of Birth: 10-19-1956   PHYSICAL THERAPY DISCHARGE SUMMARY  Visits from Start of Care: 8  Current functional  level related to goals / functional outcomes: See above for discharge status   Remaining deficits: See last progress note    Education / Equipment: HEP  Plan: Patient agrees to discharge.  Patient goals were partially met. Patient is being discharged due to not returning since the last visit.  ?????     Fenris Cauble P. Helene Kelp PT, MPH 09/19/16 2:50 PM

## 2016-05-14 NOTE — Patient Instructions (Signed)
Piriformis Stretch    Lying on back, pull right knee toward opposite shoulder. Hold _60___ seconds. Repeat __3__ times. Do ___3 (minimum0_ sessions per day. Vary angles bringing right knee toward left shoulder hold 30-60 sec 3 reps 3 times/day    Quads / HF, Supine   Lie near edge of bed, pull both knees up toward chest. Hold one knee as you drop the other leg off the edge of the bed.  Relax hanging knee/can bend knee back if indicated. Hold 60 seconds. Repeat 3 times per session. Do 2-3 sessions per day.   Quads / HF, Lunge    Right knee on surface, hands on forward thigh, Hold __30-60_ seconds. Repeat _3__ times per session. Do _3__ sessions per day.     Quads / HF, Standing  Stand with left hip against the counter top right hip extended past the edge of the counter  Step right foot and leg back  Hold 60 sec 3-5 reps  Several times/day

## 2016-05-16 ENCOUNTER — Encounter: Payer: BLUE CROSS/BLUE SHIELD | Admitting: Rehabilitative and Restorative Service Providers"

## 2017-03-23 DIAGNOSIS — M0579 Rheumatoid arthritis with rheumatoid factor of multiple sites without organ or systems involvement: Secondary | ICD-10-CM

## 2017-03-23 DIAGNOSIS — Z79899 Other long term (current) drug therapy: Secondary | ICD-10-CM | POA: Insufficient documentation

## 2017-03-23 HISTORY — DX: Rheumatoid arthritis with rheumatoid factor of multiple sites without organ or systems involvement: M05.79

## 2017-11-16 LAB — HM MAMMOGRAPHY

## 2018-06-02 LAB — CBC AND DIFFERENTIAL
HEMATOCRIT: 42 (ref 36–46)
Hemoglobin: 13.6 (ref 12.0–16.0)
Platelets: 221 (ref 150–399)
WBC: 7.2

## 2018-06-02 LAB — BASIC METABOLIC PANEL
BUN: 13 (ref 4–21)
Creatinine: 0.8 (ref 0.5–1.1)
GLUCOSE: 138
Potassium: 4.1 (ref 3.4–5.3)
Sodium: 142 (ref 137–147)

## 2018-06-02 LAB — HEPATIC FUNCTION PANEL
ALK PHOS: 85 (ref 25–125)
ALT: 19 (ref 7–35)
AST: 22 (ref 13–35)
BILIRUBIN, TOTAL: 0.3

## 2018-06-02 LAB — TSH: TSH: 1.17 (ref 0.41–5.90)

## 2018-06-02 LAB — LIPID PANEL
Cholesterol: 211 — AB (ref 0–200)
HDL: 65 (ref 35–70)
LDL Cholesterol: 133
Triglycerides: 65 (ref 40–160)

## 2018-06-02 LAB — VITAMIN D 25 HYDROXY (VIT D DEFICIENCY, FRACTURES): VIT D 25 HYDROXY: 88.5

## 2018-06-08 ENCOUNTER — Encounter: Payer: Self-pay | Admitting: Family Medicine

## 2018-06-08 ENCOUNTER — Ambulatory Visit (INDEPENDENT_AMBULATORY_CARE_PROVIDER_SITE_OTHER): Payer: BLUE CROSS/BLUE SHIELD

## 2018-06-08 ENCOUNTER — Ambulatory Visit (INDEPENDENT_AMBULATORY_CARE_PROVIDER_SITE_OTHER): Payer: BLUE CROSS/BLUE SHIELD | Admitting: Family Medicine

## 2018-06-08 VITALS — BP 115/59 | HR 74 | Ht 61.0 in | Wt 115.0 lb

## 2018-06-08 DIAGNOSIS — Z780 Asymptomatic menopausal state: Secondary | ICD-10-CM

## 2018-06-08 DIAGNOSIS — M25551 Pain in right hip: Secondary | ICD-10-CM

## 2018-06-08 DIAGNOSIS — M25552 Pain in left hip: Secondary | ICD-10-CM | POA: Diagnosis not present

## 2018-06-08 NOTE — Patient Instructions (Signed)
Thank you for coming in today. Consider injections.  Discuss with rheumatology about biologics  Eval bone density.  Recheck soon.

## 2018-06-08 NOTE — Progress Notes (Signed)
Stacy Reed is a 62 y.o. female who presents to [facility] today for hip pain.   She has a 4 year history of bilateral hip pain. She rides horses and notes the pain is particularly bad during riding and has reached the point on inability to ride.  Right hip pain is worse than left.  Pain is felt at the anterior aspect of the hip worse with activities.  She does note some pain at rest.  She has difficulty specifically with riding her horse, and getting down to tie her shoes or put socks on.  She notes getting in and out of cars is also problematic.  He notes the pain will be bad at night as well.  She has a pertinent medical history for rheumatoid arthritis managed with methotrexate quite well.  She notes recent markers have been well controlled. She has a history of rheumatoid arthritis and her rheumatologist thinks it is related to her RA and wants to add a biologic. She wanted to come and get xrays and another opinion before added the biologic. She has tried PT in the past that she says has provided minimal relief. Her primary goal is to get her pain under control and get back to riding.    ROS:  No headache, visual changes, nausea, vomiting, diarrhea, constipation, dizziness, abdominal pain, skin rash, fevers, chills, night sweats, weight loss, swollen lymph nodes, body aches, joint swelling, muscle aches, chest pain, shortness of breath, mood changes, visual or auditory hallucinations.    Exam:  BP (!) 115/59   Pulse 74   Ht 5\' 1"  (1.549 m)   Wt 115 lb (52.2 kg)   BMI 21.73 kg/m  General: Well Developed, well nourished, and in no acute distress.  Neuro/Psych: Alert and oriented x3, extra-ocular muscles intact, able to move all 4 extremities, sensation grossly intact. Skin: Warm and dry, no rashes noted.  Respiratory: Not using accessory muscles, speaking in full sentences, trachea midline.  Cardiovascular: Pulses palpable, no extremity  edema. Abdomen: Does not appear distended. Hips:  Non tender to palpation over the greater trochanteric areas  ROM 10 deg internal rotation 15 def external rotation on right  15 deg internal rotation 20 deg external rotation on left  Strength normal bilaterally  Normal gait.   Lab and Radiology Results X-ray hip bilaterally images personally independently reviewed Degenerative changes present in the hips bilaterally moderate.  Some pincer spurring present at the acetabulum concerning for femoral acetabular impingement Awaiting formal radiology review    Assessment and Plan: 62 y.o. female with a history of RA with bilateral hip pain.  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  I am awaiting formal radiology review because the degenerative appearance of the x-rays is not classic for osteoarthritis.    Lengthy discussion of options.  At this point plan for awaiting formal radiology review.  Consider biologic therapy with rheumatologist.  Additionally consider trial of diagnostic and therapeutic hip injections.  I anticipate a need for total hip replacement in the future and will obtain a bone density test as that will help for total hip replacement planning in the next 1 to 2 years.    Orders Placed This Encounter  Procedures  . DG HIPS BILAT WITH PELVIS 2V    Standing Status:   Future    Number of Occurrences:   1    Standing Expiration Date:   08/10/2019  Order Specific Question:   Reason for Exam (SYMPTOM  OR DIAGNOSIS REQUIRED)    Answer:   eval pain BL    Order Specific Question:   Is patient pregnant?    Answer:   No    Order Specific Question:   Preferred imaging location?    Answer:   [facility]    Order Specific Question:   Radiology Contrast Protocol - do NOT remove file path    Answer:   [email or web address].pdf  . DG Bone Density    Standing Status:    Future    Standing Expiration Date:   08/09/2019    Order Specific Question:   Reason for exam:    Answer:   screen bone densiity    Order Specific Question:   Preferred imaging location?    Answer:   [facility]   No orders of the defined types were placed in this encounter.   Historical information moved to improve visibility of documentation.  Past Medical History:  Diagnosis Date  . Anxiety 06/09/2018  . Hyperlipidemia 06/09/2018  . Rheumatoid arthritis involving multiple sites with positive rheumatoid factor (HCC) 03/23/2017   History reviewed. No pertinent surgical history. Social History   Tobacco Use  . Smoking status: Never Smoker  . Smokeless tobacco: Never Used  Substance Use Topics  . Alcohol use: Not on file   family history is not on file.  Medications: Current Outpatient Medications  Medication Sig Dispense Refill  . citalopram (CELEXA) 20 MG tablet Take 20 mg by mouth daily.    . folic acid (FOLVITE) 1 MG tablet Take by mouth.    . methotrexate (RHEUMATREX) 2.5 MG tablet Take 20 mg by mouth once a week.    Marland Kitchen acetaminophen (TYLENOL) 500 MG tablet Take by mouth.    . ALPRAZolam (XANAX) 0.25 MG tablet Take by mouth.    . cyanocobalamin ([employer] VITAMIN B12) 1000 MCG tablet Take by mouth.    . Omega-3 Fatty Acids (FISH OIL) 1000 MG CAPS Take by mouth.    . TURMERIC PO Take by mouth.     No current facility-administered medications for this visit.    Allergies  Allergen Reactions  . Duloxetine Hcl Rash  . Terbinafine Other (See Comments)    Myalgia  . Terbinafine Hcl Other (See Comments)    Myalgia  . Dimethicone Rash      Discussed warning signs or symptoms. Please see discharge instructions. Patient expresses understanding.  I personally was present and performed or re-performed the history, physical exam and medical decision-making activities of this service and have verified that the service and findings are accurately documented in the  student's note. ___________________________________________ Clementeen Graham M.D., ABFM., CAQSM. Primary Care and Sports Medicine Adjunct Instructor of Family Medicine  [employer]

## 2018-06-09 ENCOUNTER — Encounter: Payer: Self-pay | Admitting: Family Medicine

## 2018-06-09 DIAGNOSIS — E785 Hyperlipidemia, unspecified: Secondary | ICD-10-CM

## 2018-06-09 DIAGNOSIS — G4700 Insomnia, unspecified: Secondary | ICD-10-CM | POA: Insufficient documentation

## 2018-06-09 DIAGNOSIS — F419 Anxiety disorder, unspecified: Secondary | ICD-10-CM

## 2018-06-09 HISTORY — DX: Anxiety disorder, unspecified: F41.9

## 2018-06-09 HISTORY — DX: Hyperlipidemia, unspecified: E78.5

## 2018-06-17 ENCOUNTER — Encounter: Payer: Self-pay | Admitting: Family Medicine

## 2018-06-22 ENCOUNTER — Encounter: Payer: Self-pay | Admitting: Family Medicine

## 2018-07-02 ENCOUNTER — Ambulatory Visit (INDEPENDENT_AMBULATORY_CARE_PROVIDER_SITE_OTHER): Payer: BLUE CROSS/BLUE SHIELD

## 2018-07-02 DIAGNOSIS — M85852 Other specified disorders of bone density and structure, left thigh: Secondary | ICD-10-CM | POA: Diagnosis not present

## 2018-07-02 DIAGNOSIS — Z780 Asymptomatic menopausal state: Secondary | ICD-10-CM

## 2018-07-02 DIAGNOSIS — M8588 Other specified disorders of bone density and structure, other site: Secondary | ICD-10-CM

## 2018-07-05 ENCOUNTER — Encounter: Payer: Self-pay | Admitting: Family Medicine

## 2018-07-05 DIAGNOSIS — M8580 Other specified disorders of bone density and structure, unspecified site: Secondary | ICD-10-CM

## 2018-07-05 HISTORY — DX: Other specified disorders of bone density and structure, unspecified site: M85.80

## 2018-07-08 ENCOUNTER — Ambulatory Visit (INDEPENDENT_AMBULATORY_CARE_PROVIDER_SITE_OTHER): Payer: BLUE CROSS/BLUE SHIELD | Admitting: Family Medicine

## 2018-07-08 ENCOUNTER — Encounter: Payer: Self-pay | Admitting: Family Medicine

## 2018-07-08 VITALS — BP 115/61 | HR 79 | Wt 111.0 lb

## 2018-07-08 DIAGNOSIS — M25551 Pain in right hip: Secondary | ICD-10-CM

## 2018-07-08 DIAGNOSIS — M25552 Pain in left hip: Secondary | ICD-10-CM

## 2018-07-08 DIAGNOSIS — M85851 Other specified disorders of bone density and structure, right thigh: Secondary | ICD-10-CM

## 2018-07-08 DIAGNOSIS — M160 Bilateral primary osteoarthritis of hip: Secondary | ICD-10-CM

## 2018-07-08 NOTE — Patient Instructions (Signed)
Thank you for coming in today. Keep me updated.  Pay attention to how the hip feels.  Let me know if you want  A referral to a surgeon.

## 2018-07-09 NOTE — Progress Notes (Signed)
Stacy Reed is a 62 y.o. female who presents to [facility] today for bilateral hip pain.  Stacy Reed was seen July 16 for bilateral hip pain right worse than left.  X-ray at the time revealed moderate DJD.  Her pain is in the setting of well-controlled rheumatoid arthritis as well.  After discussion she would like to proceed with diagnostic and potentially therapeutic injection today.  She notes bothersome bilateral groin pain that interferes with her ability to ride horses and complete some activities of daily living at home which is very important to her.    ROS:  As above  Exam:  BP 115/61   Pulse 79   Wt 111 lb (50.3 kg)   BMI 20.97 kg/m  General: Well Developed, well nourished, and in no acute distress.  Neuro/Psych: Alert and oriented x3, extra-ocular muscles intact, able to move all 4 extremities, sensation grossly intact. Skin: Warm and dry, no rashes noted.  Respiratory: Not using accessory muscles, speaking in full sentences, trachea midline.  Cardiovascular: Pulses palpable, no extremity edema. Abdomen: Does not appear distended. MSK:  Right hip: Normal-appearing Nontender.  Limited internal and external rotation range of motion.   Lab and Radiology Results Procedure: Real-time Ultrasound Guided Injection of right femoral acetabular hip joint Device: GE Logiq E   Images permanently stored and available for review in the ultrasound unit. Verbal informed consent obtained.  Discussed risks and benefits of procedure. Warned about infection bleeding damage to structures skin hypopigmentation and fat atrophy among others. Patient expresses understanding and agreement Time-out conducted.   Noted no overlying erythema, induration, or other signs of local infection.   Skin prepped in a sterile fashion.   Local anesthesia: Topical Ethyl chloride.   With sterile technique and under real time ultrasound guidance:  40 mg of Kenalog and 4  mL of Marcaine injected easily.   Completed without difficulty   Pain immediately resolved suggesting accurate placement of the medication.   Advised to call if fevers/chills, erythema, induration, drainage, or persistent bleeding.   Images permanently stored and available for review in the ultrasound unit.  Impression: Technically successful ultrasound guided injection.   Assessment and Plan: 62 y.o. female with right hip pain due to DJD.  Patient has significant signs of improvement following diagnostic and therapeutic injection.  I believe the cause of her pain is her femoral acetabular joint which is very likely due to osteoarthritis.  Discussion she will see how she responds to the steroid component of the hip injection.  I think it is likely that she is going to benefit from total hip replacement in the near future.  I spent 15 minutes with this patient, greater than 50% was face-to-face time counseling regarding ddx and plan and total hip replacment.   Historical information moved to improve visibility of documentation.  Past Medical History:  Diagnosis Date  . Anxiety 06/09/2018  . Hyperlipidemia 06/09/2018  . Rheumatoid arthritis involving multiple sites with positive rheumatoid factor (HCC) 03/23/2017   No past surgical history on file. Social History   Tobacco Use  . Smoking status: Never Smoker  . Smokeless tobacco: Never Used  Substance Use Topics  . Alcohol use: Not on file   family history is not on file.  Medications: Current Outpatient Medications  Medication Sig Dispense Refill  . ALPRAZolam (XANAX) 0.25 MG tablet Take by mouth.    . citalopram (CELEXA) 20 MG tablet Take 20 mg by mouth daily.    .Marland Kitchen  cyanocobalamin ([employer] VITAMIN B12) 1000 MCG tablet Take by mouth.    . methotrexate (RHEUMATREX) 2.5 MG tablet Take 20 mg by mouth once a week.    . Omega-3 Fatty Acids (FISH OIL) 1000 MG CAPS Take by mouth.    . TURMERIC PO Take by mouth.     No current  facility-administered medications for this visit.    Allergies  Allergen Reactions  . Duloxetine Hcl Rash  . Terbinafine Other (See Comments)    Myalgia  . Hydroxyethyl Methacrylate     Allergy test. ?Cement  . Terbinafine Hcl Other (See Comments)    Myalgia  . Dimethicone Rash      Discussed warning signs or symptoms. Please see discharge instructions. Patient expresses understanding.

## 2018-08-05 ENCOUNTER — Telehealth: Payer: Self-pay | Admitting: Family Medicine

## 2018-08-05 NOTE — Telephone Encounter (Signed)
Stacy Reed is an ortho patient of your.You treated her for hip pain and also a bone density  Test. You recommended hip surgery. Pt asked if you were accepting new patients for primary care. I explained that you were not at this time. She wanted to know if you would be willing to take her on as a new patient,because of all the issues that you have already treated her for.

## 2018-08-06 NOTE — Telephone Encounter (Signed)
Yes we will see.  Please have patient establish care visit.

## 2018-08-09 NOTE — Telephone Encounter (Signed)
Please call and schedule pt

## 2018-08-27 ENCOUNTER — Encounter: Payer: Self-pay | Admitting: Family Medicine

## 2018-08-27 ENCOUNTER — Ambulatory Visit: Payer: BLUE CROSS/BLUE SHIELD | Admitting: Family Medicine

## 2018-08-27 VITALS — BP 104/68 | HR 79 | Ht 61.0 in | Wt 113.0 lb

## 2018-08-27 DIAGNOSIS — Z1239 Encounter for other screening for malignant neoplasm of breast: Secondary | ICD-10-CM

## 2018-08-27 DIAGNOSIS — M169 Osteoarthritis of hip, unspecified: Secondary | ICD-10-CM

## 2018-08-27 DIAGNOSIS — M160 Bilateral primary osteoarthritis of hip: Secondary | ICD-10-CM

## 2018-08-27 DIAGNOSIS — Z23 Encounter for immunization: Secondary | ICD-10-CM | POA: Diagnosis not present

## 2018-08-27 DIAGNOSIS — M0579 Rheumatoid arthritis with rheumatoid factor of multiple sites without organ or systems involvement: Secondary | ICD-10-CM

## 2018-08-27 DIAGNOSIS — E782 Mixed hyperlipidemia: Secondary | ICD-10-CM

## 2018-08-27 DIAGNOSIS — Z79899 Other long term (current) drug therapy: Secondary | ICD-10-CM

## 2018-08-27 HISTORY — DX: Osteoarthritis of hip, unspecified: M16.9

## 2018-08-27 NOTE — Patient Instructions (Addendum)
Thank you for coming in today. Schedule mammogram in December at [place]. Schedule well adult pap smear in about 3 months.  Return sooner if needed.

## 2018-08-27 NOTE — Progress Notes (Signed)
Stacy Reed is a 62 y.o. female who presents to [facility] today for establish care.  I seen Kamryn previously for her orthopedic/sports medicine issues.  She is here today to establish primary care.  She notes that she is done reasonably well recently with her hips.  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  Had a intra-articular right hip injection in August 15 and notes that she is had some considerable symptom improvement.  She is had a second opinion also with another orthopedic surgeon who recommends delaying surgery if possible.  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  She has several pertinent medical problems including rheumatoid arthritis that is well managed currently.  She takes methotrexate below.  She is switching her rheumatologist and has an upcoming appointment with a rheumatologist at [facility] in the near future.   ROS as above:  Exam:  BP 104/68   Pulse 79   Ht 5' 1"  (1.549 m)   Wt 113 lb (51.3 kg)   BMI 21.35 kg/m  Wt Readings from Last 5 Encounters:  08/27/18 113 lb (51.3 kg)  07/08/18 111 lb (50.3 kg)  06/08/18 115 lb (52.2 kg)    Gen: Well NAD HEENT: EOMI,  MMM Lungs: Normal work of breathing. CTABL Heart: RRR no MRG Abd: NABS, Soft. Nondistended, Nontender Exts: Brisk capillary refill, warm and well perfused.  Normal gait  Lab and Radiology Results See attached labs below.  Will be abstracted.    Assessment and Plan: 62 y.o. female with  Hip pain, rheumatoid arthritis, anxiety, hyperlipidemia.  Symptoms seems to be pretty well controlled overall.  Anticipate lab follow-up with new rheumatology appointment.  Patient would benefit in the future from  checking a lipid panel A1c and recheck vitamin D.  She is getting labs frequently and I am hopeful that her rheumatologist can either check these labs or have them added on.  I am happy to continue to manage this problem.  Patient has multiple outstanding issues on her health maintenance.  However review of her medical record reveals that she has had most of these items completed. We will scan her abstract her hepatitis C and HIV screening, Pap smear, mammogram, and colonoscopy.  We will administer a Tdap vaccine today. Additionally we discussed flu vaccine and patient would like to hold off a bit.  Recheck in 3 months for a well adult visit we will repeat Pap smear at that time.  Last Pap smear was in 2016 and had negative cytology and HPV testing. Mammogram ordered for anytime after December 24. Anticipate follow-up   Orders Placed This Encounter  Procedures  . MM 3D SCREEN BREAST BILATERAL    Standing Status:   Future    Standing Expiration Date:   10/28/2019    Order Specific Question:   Reason for Exam (SYMPTOM  OR DIAGNOSIS REQUIRED)    Answer:   screen breast cancer any time after Dec 24th    Order Specific Question:   Is the patient pregnant?    Answer:   No    Order Specific Question:   Preferred imaging location?    Answer:   [facility]  . Tdap vaccine greater than or equal to 7yo IM  No orders of the defined types were placed in this encounter.    Historical information moved to improve visibility of documentation.  Past Medical History:  Diagnosis Date  . Anxiety 06/09/2018  . Hyperlipidemia 06/09/2018  . Rheumatoid arthritis involving multiple sites with positive rheumatoid factor ([place]) 03/23/2017   No past surgical history on file. Social History   Tobacco Use  . Smoking status: Never Smoker  . Smokeless tobacco: Never Used  Substance Use Topics  . Alcohol use: Not on file   family history is not on file.  Medications: Current Outpatient  Medications  Medication Sig Dispense Refill  . ALPRAZolam (XANAX) 0.25 MG tablet Take by mouth.    . ASHWAGANDHA PO Take by mouth.    . cholecalciferol (VITAMIN D) 1000 units tablet Take 1,000 Units by mouth daily.    . cyanocobalamin ([employer] VITAMIN B12) 1000 MCG tablet Take by mouth.    . folic acid (FOLVITE) 1 MG tablet Take 2 mg by mouth daily.    . methotrexate (RHEUMATREX) 2.5 MG tablet Take 20 mg by mouth once a week.    . Multiple Minerals-Vitamins (CALCIUM CITRATE PLUS/MAGNESIUM) TABS Take by mouth daily.    . Omega-3 Fatty Acids (FISH OIL) 1000 MG CAPS Take by mouth.    . TURMERIC PO Take by mouth.     No current facility-administered medications for this visit.    Allergies  Allergen Reactions  . Duloxetine Hcl Rash  . Terbinafine Other (See Comments)    Myalgia  . Hydroxyethyl Methacrylate     Allergy test. ?Cement  . Terbinafine Hcl Other (See Comments)    Myalgia  . Dimethicone Rash     Discussed warning signs or symptoms. Please see discharge instructions. Patient expresses understanding.  Care Everywhere Result Report CBC And DifferentialResulted: 06/02/2018 5:36 AM [facility] Component Name Value Ref Range  WBC 7.2 3.4 - 10.8 x10E3/uL  RBC 4.40 3.77 - 5.28 x10E6/uL  Hemoglobin 13.6 11.1 - 15.9 g/dL  Hematocrit 41.7 34 - 46.6 %  MCV 95 79 - 97 fL  MCH 30.9 26.6 - 33 pg  MCHC 32.6 31.5 - 35.7 g/dL  RDW 12.6 12.3 - 15.4 %  Platelet Count 221 150 - 450 x10E3/uL  Neutrophils 57 Not Estab. %  Lymphs Relative  33 Not Estab. %  Monocytes 8 Not Estab. %  Eos Relative  1 Not Estab. %  Basos Relative  1 Not Estab. %  Neutrophils Absolute 4.2 1.4 - 7 x10E3/uL  Lymphocytes Absolute 2.4 0.7 - 3.1 x10E3/uL  Monocytes Absolute 0.6 0.1 - 0.9 x10E3/uL  Eosinophils Absolute 0.1 0 - 0.4 x10E3/uL  Basophils Absolute 0.1 0 - 0.2 x10E3/uL  Immature Granulocytes 0 Not Estab. %  Immature Grans (Abs) 0.0 0 - 0.1 x10E3/uL  Specimen Collected on  Blood 06/01/2018 2:40 PM    Result Narrative  Performed at:  [facility] [address] [place], [place]  [id number] Lab Director: Rush Farmer MD, Phone:  [phone]   Care Everywhere Result Report Comprehensive Metabolic PanelResulted: 4/53/6468 5:36 AM [facility] Component Name Value Ref Range  Glucose 138 (H) 65 - 99 mg/dL  BUN 13 6 - 24 mg/dL  Creatinine, Serum 0.77 0.57 - 1 mg/dL  eGFR If NonAfrican American 85 >59 mL/min/1.73  eGFR If African American 98 >59 mL/min/1.73  BUN/Creatinine Ratio 17 9 - 23   Sodium 142 134 - 144 mmol/L  Potassium 4.1 3.5 - 5.2 mmol/L  Chloride 101 96 - 106 mmol/L  CO2 25 20 - 29 mmol/L  CALCIUM 9.4 8.7 - 10.2 mg/dL  Total Protein 7.2 6 - 8.5 g/dL  Albumin, Serum 4.4 3.5 - 5.5 g/dL  Globulin, Total 2.8 1.5 - 4.5 g/dL  Albumin/Globulin Ratio 1.6 1.2 - 2.2   Total Bilirubin 0.3 0 - 1.2 mg/dL  Alkaline Phosphatase 85 39 - 117 IU/L  AST 22 0 - 40 IU/L  ALT (SGPT) 19 0 - 32 IU/L  Specimen Collected on  Blood 06/01/2018 2:40 PM  Result Narrative  Performed at:  [facility] [address] [place], [place]  [id number] Lab Director: Rush Farmer MD, Phone:  [phone]   Care Everywhere Result Report Lipid PanelResulted: 09/29/2017 12:35 AM [facility] Component Name Value Ref Range  Cholesterol, Total 211 (H) 100 - 199 mg/dL  Triglycerides 65 0 - 149 mg/dL  HDL 65 >39 mg/dL  VLDL Cholesterol Cal 13 5 - 40 mg/dL  LDL 133 (H) 0 - 99 mg/dL  Result Narrative  Performed at:  [address] [place], [place]  [id number] Lab Director: Rush Farmer MD, Phone:  [phone]   Care Everywhere Result Report Vitamin D 25 HydroxyResulted: 09/29/2017 12:35 AM [facility] Component Name Value Ref Range  Vit D, 25-Hydroxy 88.5  Comment: Vitamin D deficiency has been defined by the [place] practice guideline as a level of serum 25-OH vitamin D less than 20 ng/mL (1,2). The Endocrine  Society went on to further define vitamin D insufficiency as a level between 21 and 29 ng/mL (2). 1. IOM (Institute of Medicine). 2010. Dietary reference    intakes for calcium and D. [place]: The    [employer]. 2. Holick MF, Binkley Harrietta, Bischoff-Ferrari HA, et al.    Evaluation, treatment, and prevention of vitamin D    deficiency: an Endocrine Society clinical practice    guideline. JCEM. 2011 Jul; 96(7):1911-30. 30 - 100 ng/mL  Result Narrative  Performed at:  [address] [place], [place]  [id number] Lab Director: Rush Farmer MD, Phone:  [phone]   Care Everywhere Result Report TSH Rfx on Abnormal to Free T4Resulted: 09/29/2017 12:35 AM [facility] Component Name Value Ref Range  TSH 1.170 0.45 - 4.5 uIU/mL  Result Narrative  Performed at:  [address] [place], [place]  [id number] Lab Director: Rush Farmer MD, Phone:  [phone]   Care Everywhere Result Report Hepatitis C AntibodyResulted: 03/25/2017 5:42 AM [facility] Component Name Value Ref Range  Hepatitis C Virus Ab <0.1  Comment:                                   Negative:     < 0.8                              Indeterminate: 0.8 - 0.9                                   Positive:     > 0.9  The CDC recommends that a positive HCV antibody result  be followed up with a HCV Nucleic Acid Amplification  test (035009). 0 - 0.9 s/co ratio  Specimen Collected on  Blood 03/23/2017 3:24 PM  Result Narrative  Performed at:  [address] [place], [place]  [id number] Lab Director: Lindon Romp MD, Phone:  [phone]   Care Everywhere Result Report Hepatitis B Surface AntigenResulted: 03/25/2017 5:42 AM [facility] Component Name Value Ref Range  Hepatitis B Surface Ag Negative Negative   Specimen Collected on  Blood 03/23/2017 3:24 PM  Result Narrative  Performed at:  [facility] [address],  [place], [place]  [id number] Lab Director: Lindon Romp MD, Phone:  [phone]   Care Everywhere Result Report HSV 1 and 2-Specific Ab, IgGLast Updated: 08/22/2013 [facility] Component Name Value Ref Range  HSV 1 IgG, Type Spec 29.10 (H)  Comment:                                  Negative        <0.91                                  Equivocal 0.91 - 1.09                                  Positive        >1.09  Note: Negative indicates no antibodies detected to  HSV-1. Equivocal may suggest early infection.  If  clinically appropriate, retest at later date. Positive  indicates antibodies detected to HSV-1. 0.00 - 0.90 index   HSV 2 IgG, Type Spec 3.79 (H)  Comment:                                  Negative        <0.91                                  Equivocal 0.91 - 1.09                                  Positive        >1.09  Note: Negative indicates no antibodies detected to  HSV-2. Equivocal may suggest early infection.  If  clinically appropriate, retest at later date. Positive  indicates antibodies detected to HSV-2. 0.00 - 0.90 index   Specimen Collected on  Blood Unknown  Result Narrative  Performed at:  [address] [place], [place]  [id number] Lab Director: Lindon Romp MD, Phone:  [phone]   Care Everywhere Result Report [place] Updated: 08/22/2013 [facility] Component Name Value Ref Range  Chlamydia Trachomatis, NAA Negative Negative   Neisseria Gonorrhoeae By PCR Negative Negative   Please Note: Comment  Comment: Acceptable specimens for this test are female urethral swab, endocervical swab and liquid based pap specimens, vaginal swabs in APTIMA transports and first void urine. See online Directory of Services for test number for rectal and pharyngeal specimens.    Specimen Collected on  Genital Unknown  Result Narrative  Performed at:  [address] [place], [place]   [id number] Lab Director: Lindon Romp MD, Phone:  [phone]   Care Everywhere Result Report HIV 1 and 2  AB, EIA with ReflexesLast Updated: 08/22/2013 [facility] Component Name Value Ref Range  HIV 1/O/2 Abs-Index Value <1.00  Comment: Index Value: Specimen reactivity relative to the negative cutoff. <1.00   HIV 1/O/2 Abs, Qual Non Reactive Non Reactive   Specimen Collected on  Blood, Venous - Vein Unknown  Result Narrative  Performed at:  [address], [place], [place]  [id number] Lab Director: Lindon Romp MD, Phone:  [phone]

## 2018-09-02 ENCOUNTER — Telehealth: Payer: Self-pay | Admitting: Family Medicine

## 2018-09-02 NOTE — Telephone Encounter (Signed)
Colonoscopy records received from digestive health specialist.  Colonoscopy dated June 24, 2010. Results will be sent abstract.

## 2018-09-03 ENCOUNTER — Encounter: Payer: Self-pay | Admitting: Family Medicine

## 2018-09-16 ENCOUNTER — Encounter: Payer: Self-pay | Admitting: Family Medicine

## 2018-09-17 ENCOUNTER — Encounter: Payer: Self-pay | Admitting: Physical Therapy

## 2018-09-17 ENCOUNTER — Ambulatory Visit: Payer: BLUE CROSS/BLUE SHIELD | Admitting: Physical Therapy

## 2018-09-17 DIAGNOSIS — M25652 Stiffness of left hip, not elsewhere classified: Secondary | ICD-10-CM

## 2018-09-17 DIAGNOSIS — M25551 Pain in right hip: Secondary | ICD-10-CM | POA: Diagnosis not present

## 2018-09-17 DIAGNOSIS — M25651 Stiffness of right hip, not elsewhere classified: Secondary | ICD-10-CM

## 2018-09-17 DIAGNOSIS — M25552 Pain in left hip: Secondary | ICD-10-CM

## 2018-09-17 NOTE — Therapy (Signed)
[facility] [address] [place], [place], [address] Phone: [phone]   Fax:  [phone]  Physical Therapy Evaluation  Patient Details  Name: Stacy Reed MRN: [id number] Date of Birth: 1956/08/21 Referring Provider (PT): Darnell Level, MD   Encounter Date: 09/17/2018  PT End of Session - 09/17/18 1017    Visit Number  1    Number of Visits  12    Date for PT Re-Evaluation  10/29/18    Authorization Type  BCBS    PT Start Time  [phone]    PT Stop Time  1009    PT Time Calculation (min)  38 min    Activity Tolerance  Patient tolerated treatment well    Behavior During Therapy  [facility] for tasks assessed/performed       Past Medical History:  Diagnosis Date  . Anxiety 06/09/2018  . Hyperlipidemia 06/09/2018  . OA (osteoarthritis) of hip 08/27/2018  . Osteopenia determined by x-ray 07/05/2018   T score -2.24 June 2018  . Rheumatoid arthritis involving multiple sites with positive rheumatoid factor (HCC) 03/23/2017    History reviewed. No pertinent surgical history.  There were no vitals filed for this visit.   Subjective Assessment - 09/17/18 0934    Subjective  Pt is a 62 y/o female who presents to OPPT for bil hip pain due to OA.  Pt active with riding horses and unable due to pain.  Pt went to orthopedic surgeon and was planned for surgery, but wanted 2nd opinion.  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    Pertinent History  RA    Patient Stated Goals  improve pain, avoid surgery; wants to be able to ride horses again    Currently in Pain?  Yes    Pain Score  2    up to 5/10 (10/10 when riding horses); at best 2/10   Pain Location  Hip   and back   Pain Orientation  Right;Left   Rt>Lt   Pain Descriptors / Indicators  Sharp;Aching;Dull;Tightness    Pain Type  Chronic pain    Pain Onset  More than a month ago    Pain  Frequency  Constant    Aggravating Factors   riding horses, sitting for too long    Pain Relieving Factors  movement         [facility] PT Assessment - 09/17/18 0941      Assessment   Medical Diagnosis  OA of hip    Referring Provider (PT)  Darnell Level, MD    Onset Date/Surgical Date  --   chronic   Next MD Visit  4-5 weeks    Prior Therapy  2017      Precautions   Precautions  None      Restrictions   Weight Bearing Restrictions  No      Balance Screen   Has the patient fallen in the past 6 months  No    Has the patient had a decrease in activity level because of a fear of falling?   No    Is the patient reluctant to leave their home because of a fear of falling?   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    Leisure  horseback riding, grandchildren      Cognition   Overall Cognitive Status  Within Functional Limits for tasks assessed      Observation/Other Assessments   Focus on Therapeutic Outcomes (FOTO)   60 (40% limted; predicted 35% limited)      Posture/Postural Control   Posture/Postural Control  No significant limitations      ROM / Strength   AROM / PROM / Strength  AROM;Strength      AROM   Overall AROM Comments  lumbar ROM WNL with mild c/o tightness with forward flexion and Lt sidebending; bil hips WNL except mild limitation with internal rotation, flexion and extension      Strength   Overall Strength Comments  bil LEs 5/5 except bil hip flexion 4/5 (pain on right)      Flexibility   Soft Tissue Assessment /Muscle Length  yes    Hamstrings  mild tightness bil    Quadriceps  tightness Rt>Lt   and tightness bil hip flexors    ITB  tightness Rt>Lt    Piriformis  tightness Rt>Lt      Palpation   Palpation comment  tenderness and trigger points noted in Rt iliopsoas, TFL, lateral quad                Objective measurements completed on examination: See above findings.      [facility] Adult PT Treatment/Exercise - 09/17/18 0941      Exercises   Exercises  Knee/Hip      Knee/Hip Exercises: Stretches   Passive Hamstring Stretch  Both;1 rep;30 seconds    Quad Stretch  Both;1 rep;30 seconds    Hip Flexor Stretch  Both;1 rep;30 seconds    Hip Flexor Stretch Limitations  with quad stretch    ITB Stretch  Both;1 rep;30 seconds             PT Education - 09/17/18 1016    Education Details  HEP, DN    Person(s) Educated  Patient    Methods  Explanation;Demonstration;Handout    Comprehension  Verbalized understanding;Returned demonstration;Need further instruction          PT Long Term Goals - 09/17/18 1021      PT LONG TERM GOAL #1   Title  independent with HEP    Status  New    Target Date  10/29/18      PT LONG TERM GOAL #2   Title  FOTO score improved to </= 35% limited for improved function    Status  New    Target Date  10/29/18      PT LONG TERM GOAL #3   Title  report pain < 3/10 at end of day for improved function and sleep    Status  New    Target Date  10/29/18      PT LONG TERM GOAL #4   Title  report decreased pain with lumbar and hip ROM for improved function    Status  New    Target Date  10/29/18      PT LONG TERM GOAL #5   Title  n/a             Plan - 09/17/18 1017    Clinical Impression Statement  Pt is a 62 y/o female who presents to OPPT for bil hip pain, Rt > Lt which has been chronic.  Pt with known OA and is hopeful to prolong surgery.  Pt demonstrated mild strength deficts and ROM limitations, as well as decreased flexibility affecting functional mobility. Pt will benefit from PT to address deficits listed.    History and Personal Factors relevant  to plan of care:  RA    Clinical Presentation  Stable    Clinical Decision Making  Low    Rehab Potential  Good    PT Frequency  2x / week   1-2x/wk   PT Duration  6 weeks    PT Treatment/Interventions  ADLs/Self Care Home Management;Cryotherapy;Electrical Stimulation;Ultrasound;Moist Heat;Iontophoresis 4mg /ml Dexamethasone;Functional mobility training;Stair training;Gait training;Therapeutic activities;Therapeutic exercise;Patient/family education;Neuromuscular re-education;Balance training;Manual techniques;Taping;Dry needling;Passive range of motion    PT Next Visit Plan  DN to hip flexors/TFL/glues, manual therapy, ?taping, review HEP and add core strengthening    PT Home Exercise Plan  Access Code: [email or web address]    Consulted and Agree with Plan of Care  Patient       Patient will benefit from skilled therapeutic intervention in order to improve the following deficits and impairments:  Increased fascial restricitons, Increased muscle spasms, Pain, Hypomobility, Decreased range of motion, Decreased strength, Impaired flexibility  Visit Diagnosis: Pain in right hip - Plan: PT plan of care cert/re-cert  Pain in left hip - Plan: PT plan of care cert/re-cert  Stiffness of right hip, not elsewhere classified - Plan: PT plan of care cert/re-cert  Stiffness of left hip, not elsewhere classified - Plan: PT plan of care cert/re-cert     Problem List Patient Active Problem List   Diagnosis Date Noted  . OA (osteoarthritis) of hip 08/27/2018  . Osteopenia determined by x-ray 07/05/2018  . Anxiety 06/09/2018  . Hyperlipidemia 06/09/2018  . Insomnia 06/09/2018  . Long-term use of high-risk medication 03/23/2017  . Rheumatoid arthritis involving multiple sites with positive rheumatoid factor (HCC) 03/23/2017      Clarita Crane, PT, DPT 09/17/18 10:24 AM    [facility] [address] [place], [place], [address] Phone:  [phone]   Fax:  [phone]  Name: Stacy Reed MRN: [id number] Date of Birth: [birth year]

## 2018-09-17 NOTE — Patient Instructions (Signed)
Access Code: [email or web address]  URL: [email or web address]  Date: 09/17/2018  Prepared by: Moshe Cipro   Exercises  Supine Hamstring Stretch with Strap - 3 reps - 1 sets - 30 sec hold - 2x daily - 7x weekly  Supine ITB Stretch with Strap - 3 reps - 1 sets - 30 sec hold - 2x daily - 7x weekly  Prone Quad Stretch with Towel Roll and Strap - 3 reps - 1 sets - 30 sec hold - 2x daily - 7x weekly  Patient Education  Trigger Point Dry Needling

## 2018-09-24 ENCOUNTER — Encounter: Payer: BLUE CROSS/BLUE SHIELD | Admitting: Physical Therapy

## 2018-09-29 ENCOUNTER — Encounter: Payer: Self-pay | Admitting: Physical Therapy

## 2018-09-29 ENCOUNTER — Ambulatory Visit: Payer: BLUE CROSS/BLUE SHIELD | Admitting: Physical Therapy

## 2018-09-29 DIAGNOSIS — M25551 Pain in right hip: Secondary | ICD-10-CM | POA: Diagnosis not present

## 2018-09-29 DIAGNOSIS — M25652 Stiffness of left hip, not elsewhere classified: Secondary | ICD-10-CM

## 2018-09-29 DIAGNOSIS — M25651 Stiffness of right hip, not elsewhere classified: Secondary | ICD-10-CM

## 2018-09-29 DIAGNOSIS — M25552 Pain in left hip: Secondary | ICD-10-CM | POA: Diagnosis not present

## 2018-09-29 NOTE — Therapy (Signed)
[facility] [place] [address] [place], [place], [address] Phone: [phone]   Fax:  [phone]  Physical Therapy Treatment  Patient Details  Name: Stacy Reed MRN: [id number] Date of Birth: 10-04-1956 Referring Provider (PT): Darnell Level, MD   Encounter Date: 09/29/2018  PT End of Session - 09/29/18 1516    Visit Number  2    Number of Visits  12    Date for PT Re-Evaluation  10/29/18    Authorization Type  BCBS    PT Start Time  1516    PT Stop Time  1615    PT Time Calculation (min)  59 min    Activity Tolerance  Patient tolerated treatment well    Behavior During Therapy  [facility] for tasks assessed/performed       Past Medical History:  Diagnosis Date  . Anxiety 06/09/2018  . Hyperlipidemia 06/09/2018  . OA (osteoarthritis) of hip 08/27/2018  . Osteopenia determined by x-ray 07/05/2018   T score -2.24 June 2018  . Rheumatoid arthritis involving multiple sites with positive rheumatoid factor (HCC) 03/23/2017    History reviewed. No pertinent surgical history.  There were no vitals filed for this visit.  Subjective Assessment - 09/29/18 1526    Subjective  Pt reports no change since last visit.  She realizes she's going to make a lifestyle change to see changes in her pain.      Patient Stated Goals  improve pain, avoid surgery; wants to be able to ride horses again    Currently in Pain?  Yes    Pain Score  4     Pain Location  Hip    Pain Orientation  Right;Left    Aggravating Factors   riding horses, sitting too long     Pain Relieving Factors  movement         [facility] PT Assessment - 09/29/18 0001      Assessment   Medical Diagnosis  OA of hip    Referring Provider (PT)  Darnell Level, MD       [facility] Adult PT Treatment/Exercise - 09/29/18 0001      Self-Care   Self-Care  Other Self-Care Comments    Other Self-Care Comments   Pt educated on self massage with ball to ant hip; pt returned demo with cues.   Pt educated on self massage to adductors with roller stick; pt verbalized understanding.       Exercises   Exercises  Knee/Hip      Knee/Hip Exercises: Stretches   Passive Hamstring Stretch  Right;30 seconds;3 reps   supine, with strap; bent knee and straight   Quad Stretch Limitations  trial in sitting; bothered back; stopped.     Hip Flexor Stretch  Right;Left;30 seconds;3 reps   one rep supine; 2 seated    Piriformis Stretch  Right;Left;2 reps;60 seconds    Piriformis Stretch Limitations  1 rep of Travell     Other Knee/Hip Stretches  trial of modified triangle pose - unable to complete Lt side; Rt  Side x 20 sec    Other Knee/Hip Stretches  butterfly stretch in sitting x 15 sec x 2 reps       Knee/Hip Exercises: Standing   Other Standing Knee Exercises  midback stretch holding onto door frame x 15 sec       Modalities   Modalities  Electrical Stimulation;Moist Heat      Moist Heat Therapy   Number Minutes Moist Heat  15  Minutes    Moist Heat Location  Hip;Lumbar Spine   bilat Management consultant Location  bilat ant hips     Electrical Stimulation Action  premod to each area    Electrical Stimulation Parameters  to tolerance     Electrical Stimulation Goals  Pain                  PT Long Term Goals - 09/17/18 1021      PT LONG TERM GOAL #1   Title  independent with HEP    Status  New    Target Date  10/29/18      PT LONG TERM GOAL #2   Title  FOTO score improved to </= 35% limited for improved function    Status  New    Target Date  10/29/18      PT LONG TERM GOAL #3   Title  report pain < 3/10 at end of day for improved function and sleep    Status  New    Target Date  10/29/18      PT LONG TERM GOAL #4   Title  report decreased pain with lumbar and hip ROM for improved function    Status  New    Target Date  10/29/18      PT LONG TERM GOAL #5   Title  n/a            Plan - 09/29/18 1531    Clinical  Impression Statement  Pt unable to tolerate NuStep (known from previous episode of care).  She had difficulty tolerating supine hip flexor stretch and modified triangle pose.  After completing stretches for LE, she reported increased midback discomfort.  Pt reported reduction in pain with use of MHP and estim at end of session.  No new goals; only 2nd visit.     Rehab Potential  Good    PT Frequency  2x / week    PT Duration  6 weeks    PT Treatment/Interventions  ADLs/Self Care Home Management;Cryotherapy;Electrical Stimulation;Ultrasound;Moist Heat;Iontophoresis 4mg /ml Dexamethasone;Functional mobility training;Stair training;Gait training;Therapeutic activities;Therapeutic exercise;Patient/family education;Neuromuscular re-education;Balance training;Manual techniques;Taping;Dry needling;Passive range of motion    PT Next Visit Plan  DN to hip flexors/TFL/glues, manual therapy, review HEP and add core strengthening    PT Home Exercise Plan  Access Code: [email or web address]    Consulted and Agree with Plan of Care  Patient       Patient will benefit from skilled therapeutic intervention in order to improve the following deficits and impairments:  Increased fascial restricitons, Increased muscle spasms, Pain, Hypomobility, Decreased range of motion, Decreased strength, Impaired flexibility  Visit Diagnosis: Pain in right hip  Pain in left hip  Stiffness of right hip, not elsewhere classified  Stiffness of left hip, not elsewhere classified     Problem List Patient Active Problem List   Diagnosis Date Noted  . OA (osteoarthritis) of hip 08/27/2018  . Osteopenia determined by x-ray 07/05/2018  . Anxiety 06/09/2018  . Hyperlipidemia 06/09/2018  . Insomnia 06/09/2018  . Long-term use of high-risk medication 03/23/2017  . Rheumatoid arthritis involving multiple sites with positive rheumatoid factor (HCC) 03/23/2017   Mayer Camel, PTA 09/29/18 5:07 PM  [facility] [place] [address] [place], [place], [address] Phone: [phone]   Fax:  [phone]  Name: Stacy Reed MRN: [id number] Date of Birth: May 04, 1956

## 2018-10-01 ENCOUNTER — Encounter: Payer: BLUE CROSS/BLUE SHIELD | Admitting: Physical Therapy

## 2018-10-04 ENCOUNTER — Ambulatory Visit (INDEPENDENT_AMBULATORY_CARE_PROVIDER_SITE_OTHER): Payer: BLUE CROSS/BLUE SHIELD | Admitting: Physical Therapy

## 2018-10-04 ENCOUNTER — Encounter: Payer: Self-pay | Admitting: Physical Therapy

## 2018-10-04 DIAGNOSIS — M25651 Stiffness of right hip, not elsewhere classified: Secondary | ICD-10-CM

## 2018-10-04 DIAGNOSIS — M25551 Pain in right hip: Secondary | ICD-10-CM

## 2018-10-04 DIAGNOSIS — M25552 Pain in left hip: Secondary | ICD-10-CM

## 2018-10-04 DIAGNOSIS — M25652 Stiffness of left hip, not elsewhere classified: Secondary | ICD-10-CM | POA: Diagnosis not present

## 2018-10-04 NOTE — Therapy (Signed)
[place], [place], [address] Phone: [phone]   Fax:  [phone]  Physical Therapy Treatment  Patient Details  Name: Stacy Reed MRN: [id number] Date of Birth: Apr 01, 1956 Referring Provider (PT): Jaynee Eagles, MD   Encounter Date: 10/04/2018  PT End of Session - 10/04/18 1050    Visit Number  3    Number of Visits  12    Date for PT Re-Evaluation  10/29/18    Authorization Type  BCBS    PT Start Time  0930    PT Stop Time  1012    PT Time Calculation (min)  42 min    Activity Tolerance  Patient tolerated treatment well    Behavior During Therapy  [facility] for tasks assessed/performed       Past Medical History:  Diagnosis Date  . Anxiety 06/09/2018  . Hyperlipidemia 06/09/2018  . OA (osteoarthritis) of hip 08/27/2018  . Osteopenia determined by x-ray 07/05/2018   T score -2.24 June 2018  . Rheumatoid arthritis involving multiple sites with positive rheumatoid factor ([place]) 03/23/2017    History reviewed. No pertinent surgical history.  There were no vitals filed for this visit.  Subjective Assessment - 10/04/18 0930    Subjective  did some hiking this weekend and is a little sore; hips are feeling "a little looser"     Patient Stated Goals  improve pain, avoid surgery; wants to be able to ride horses again    Currently in Pain?  Yes    Pain Score  2     Pain Location  Hip    Pain Orientation  Left    Pain Descriptors / Indicators  Dull;Aching    Pain Type  Chronic pain    Pain Onset  More than a month ago    Pain Frequency  Constant    Aggravating Factors   riding horses, sitting too long    Pain Relieving Factors  movement                       [facility] Adult PT Treatment/Exercise - 10/04/18 0941      Exercises   Exercises  Knee/Hip      Knee/Hip Exercises: Stretches   Passive Hamstring Stretch  Right;30 seconds;2 reps;Left    ITB Stretch  Both;2 reps;30 seconds     Piriformis Stretch  Right;Left;2 reps;60 seconds      Knee/Hip Exercises: Supine   Bridges  Both;10 reps   with walk out     Knee/Hip Exercises: Sidelying   Clams  x10 bil; green theraband      Manual Therapy   Manual Therapy  Joint mobilization    Joint Mobilization  bil hip: LAD and A/P mobs grades 1-2                  PT Long Term Goals - 09/17/18 1021      PT LONG TERM GOAL #1   Title  independent with HEP    Status  New    Target Date  10/29/18      PT LONG TERM GOAL #2   Title  FOTO score improved to </= 35% limited for improved function    Status  New    Target Date  10/29/18      PT LONG TERM GOAL #3   Title  report pain < 3/10 at end of day for improved function and sleep    Status  New    Target Date  10/29/18      PT LONG TERM GOAL #4   Title  report decreased pain with lumbar and hip ROM for improved function    Status  New    Target Date  10/29/18      PT LONG TERM GOAL #5   Title  n/a            Plan - 10/04/18 1050    Clinical Impression Statement  Pt tolerated session well and able to tolerate gentle strengthening exercises today.  No goals met, but pt reporting decreased tightness and increased functional activity.  Progressing well with PT.    Rehab Potential  Good    PT Frequency  2x / week    PT Duration  6 weeks    PT Treatment/Interventions  ADLs/Self Care Home Management;Cryotherapy;Electrical Stimulation;Ultrasound;Moist Heat;Iontophoresis 4m/ml Dexamethasone;Functional mobility training;Stair training;Gait training;Therapeutic activities;Therapeutic exercise;Patient/family education;Neuromuscular re-education;Balance training;Manual techniques;Taping;Dry needling;Passive range of motion    PT Next Visit Plan  DN to hip flexors/TFL/glues (pt declined 11/11; continue to monitor for need/willingness), manual therapy, review HEP and add core strengthening    PT Home Exercise Plan  Access Code: [email or web address]   Consulted and Agree  with Plan of Care  Patient       Patient will benefit from skilled therapeutic intervention in order to improve the following deficits and impairments:  Increased fascial restricitons, Increased muscle spasms, Pain, Hypomobility, Decreased range of motion, Decreased strength, Impaired flexibility  Visit Diagnosis: Pain in right hip  Pain in left hip  Stiffness of right hip, not elsewhere classified  Stiffness of left hip, not elsewhere classified     Problem List Patient Active Problem List   Diagnosis Date Noted  . OA (osteoarthritis) of hip 08/27/2018  . Osteopenia determined by x-ray 07/05/2018  . Anxiety 06/09/2018  . Hyperlipidemia 06/09/2018  . Insomnia 06/09/2018  . Long-term use of high-risk medication 03/23/2017  . Rheumatoid arthritis involving multiple sites with positive rheumatoid factor ([place] 03/23/2017      SLaureen Abrahams PT, DPT 10/04/18 10:55 AM     [facility] [address]Phone: [phone]  Fax:  [phone] Name: Stacy GagliardoMRN: [id number] of Birth: [birth year]

## 2018-10-08 ENCOUNTER — Encounter: Payer: Self-pay | Admitting: Family Medicine

## 2018-10-08 ENCOUNTER — Encounter: Payer: Self-pay | Admitting: Physical Therapy

## 2018-10-08 ENCOUNTER — Ambulatory Visit: Payer: BLUE CROSS/BLUE SHIELD | Admitting: Physical Therapy

## 2018-10-08 DIAGNOSIS — M25652 Stiffness of left hip, not elsewhere classified: Secondary | ICD-10-CM | POA: Diagnosis not present

## 2018-10-08 DIAGNOSIS — M25552 Pain in left hip: Secondary | ICD-10-CM

## 2018-10-08 DIAGNOSIS — M25551 Pain in right hip: Secondary | ICD-10-CM | POA: Diagnosis not present

## 2018-10-08 DIAGNOSIS — R42 Dizziness and giddiness: Secondary | ICD-10-CM

## 2018-10-08 DIAGNOSIS — M25519 Pain in unspecified shoulder: Secondary | ICD-10-CM

## 2018-10-08 DIAGNOSIS — M542 Cervicalgia: Secondary | ICD-10-CM

## 2018-10-08 DIAGNOSIS — M25651 Stiffness of right hip, not elsewhere classified: Secondary | ICD-10-CM | POA: Diagnosis not present

## 2018-10-08 NOTE — Therapy (Signed)
[facility] [place] [address] [place], [place], [address] Phone: [phone]   Fax:  [phone]  Physical Therapy Treatment  Patient Details  Name: Stacy Boastracy Reed MRN: [id number] Date of Birth: Apr 10, 1956 Referring Provider (PT): Darnell LevelSlade Moore, MD   Encounter Date: 10/08/2018  PT End of Session - 10/08/18 1046    Visit Number  4    Number of Visits  12    Date for PT Re-Evaluation  10/29/18    Authorization Type  BCBS    PT Start Time  0933    PT Stop Time  1029    PT Time Calculation (min)  56 min    Activity Tolerance  Patient tolerated treatment well    Behavior During Therapy  [facility] for tasks assessed/performed       Past Medical History:  Diagnosis Date  . Anxiety 06/09/2018  . Hyperlipidemia 06/09/2018  . OA (osteoarthritis) of hip 08/27/2018  . Osteopenia determined by x-ray 07/05/2018   T score -2.24 June 2018  . Rheumatoid arthritis involving multiple sites with positive rheumatoid factor (HCC) 03/23/2017    History reviewed. No pertinent surgical history.  There were no vitals filed for this visit.  Subjective Assessment - 10/08/18 0934    Subjective  Pt reports she has not riden any horses in a couple weeks.  She has avoided getting on horses.  She feels her flexibility has improved.  She has pain in evening (up to 5/10), going up stairs, or after prolonged sitting.     Patient Stated Goals  improve pain, avoid surgery; wants to be able to ride horses again    Currently in Pain?  No/denies    Pain Score  0-No pain   up to 5/10 at night         [facility] PT Assessment - 10/08/18 0001      Assessment   Medical Diagnosis  OA of hip    Referring Provider (PT)  Darnell LevelSlade Moore, MD        [facility] Treatment/Exercise - 10/08/18 0001      Knee/Hip Exercises: Stretches   Hip Flexor Stretch  Right;Left;2 reps;30 seconds   arm overhead   ITB Stretch  Right;Left;2 reps   standing   Piriformis Stretch   Right;Left;3 reps;30 seconds   seated    Other Knee/Hip Stretches  seated criss cross leg while sitting on 10" step x 20 sec x 2 reps,  adductor/hamstring stretch, single leg out (while sitting on 10" step) x 30 sec x 2.       Knee/Hip Exercises: Standing   Other Standing Knee Exercises  simulated mounting of horse (straddling Nautilus chest press seat) x 2 reps - 1st rep painful, 2nd one improved tolerance       Moist Heat Therapy   Number Minutes Moist Heat  15 Minutes    Moist Heat Location  Lumbar Spine;Hip      Electrical Stimulation   Electrical Stimulation Location  Rt ant/ post hip and low back    Electrical Stimulation Action  premod to each area    Electrical Stimulation Parameters  to pt tolerance    Electrical Stimulation Goals  Pain      Manual Therapy   Manual Therapy  Myofascial release;Soft tissue mobilization    Soft tissue mobilization  STM to Rt adductors, QL, piriformis     Myofascial Release  MFR to Rt iliopsoas         PT Long Term Goals -  09/17/18 1021      PT LONG TERM GOAL #1   Title  independent with HEP    Status  New    Target Date  10/29/18      PT LONG TERM GOAL #2   Title  FOTO score improved to </= 35% limited for improved function    Status  New    Target Date  10/29/18      PT LONG TERM GOAL #3   Title  report pain < 3/10 at end of day for improved function and sleep    Status  New    Target Date  10/29/18      PT LONG TERM GOAL #4   Title  report decreased pain with lumbar and hip ROM for improved function    Status  New    Target Date  10/29/18      PT LONG TERM GOAL #5   Title  n/a            Plan - 10/08/18 1044    Clinical Impression Statement  Pt continues with tightness in bilat hips and LE (R>L).  Pt tolerated stretching modifications well.  She reported reduction in tightness and greater ease of movement with gait after manual therapy.  Pt making gradual gains towards goals.  Pt will be mounting horse over weekend  and will assess if this is any easier or less painful.  Pt interested in trial of DN in future session.     Rehab Potential  Good    PT Frequency  2x / week    PT Duration  6 weeks    PT Treatment/Interventions  ADLs/Self Care Home Management;Cryotherapy;Electrical Stimulation;Ultrasound;Moist Heat;Iontophoresis 4mg /ml Dexamethasone;Functional mobility training;Stair training;Gait training;Therapeutic activities;Therapeutic exercise;Patient/family education;Neuromuscular re-education;Balance training;Manual techniques;Taping;Dry needling;Passive range of motion    PT Next Visit Plan  DN to hip flexors/TFL/glues (pt declined 11/11; continue to monitor for need/willingness), manual therapy, review HEP and add core strengthening    Consulted and Agree with Plan of Care  Patient       Patient will benefit from skilled therapeutic intervention in order to improve the following deficits and impairments:  Increased fascial restricitons, Increased muscle spasms, Pain, Hypomobility, Decreased range of motion, Decreased strength, Impaired flexibility  Visit Diagnosis: Pain in right hip  Pain in left hip  Stiffness of right hip, not elsewhere classified  Stiffness of left hip, not elsewhere classified     Problem List Patient Active Problem List   Diagnosis Date Noted  . OA (osteoarthritis) of hip 08/27/2018  . Osteopenia determined by x-ray 07/05/2018  . Anxiety 06/09/2018  . Hyperlipidemia 06/09/2018  . Insomnia 06/09/2018  . Long-term use of high-risk medication 03/23/2017  . Rheumatoid arthritis involving multiple sites with positive rheumatoid factor (HCC) 03/23/2017   Mayer Camel, PTA 10/08/18 10:50 AM   [facility] [place] [address] [place], [place], [address] Phone: [phone]   Fax:  [phone]  Name: Stacy Reed MRN: [id number] Date of Birth: [birth year]

## 2018-10-13 ENCOUNTER — Encounter: Payer: BLUE CROSS/BLUE SHIELD | Admitting: Physical Therapy

## 2018-10-15 ENCOUNTER — Telehealth: Payer: Self-pay | Admitting: Physical Therapy

## 2018-10-15 ENCOUNTER — Encounter: Payer: BLUE CROSS/BLUE SHIELD | Admitting: Physical Therapy

## 2018-10-15 NOTE — Telephone Encounter (Signed)
Called pt due to no show for appt today.  Pt forgot appt had been changed to 8:45.  Reminded of next appt.  Clarita CraneStephanie F Justice Milliron, PT, DPT 10/15/18 9:15 AM

## 2018-10-18 ENCOUNTER — Ambulatory Visit: Payer: BLUE CROSS/BLUE SHIELD | Admitting: Physical Therapy

## 2018-10-18 ENCOUNTER — Encounter: Payer: Self-pay | Admitting: Physical Therapy

## 2018-10-18 DIAGNOSIS — M25551 Pain in right hip: Secondary | ICD-10-CM | POA: Diagnosis not present

## 2018-10-18 DIAGNOSIS — M25552 Pain in left hip: Secondary | ICD-10-CM | POA: Diagnosis not present

## 2018-10-18 DIAGNOSIS — R2681 Unsteadiness on feet: Secondary | ICD-10-CM

## 2018-10-18 DIAGNOSIS — M25651 Stiffness of right hip, not elsewhere classified: Secondary | ICD-10-CM

## 2018-10-18 DIAGNOSIS — R42 Dizziness and giddiness: Secondary | ICD-10-CM

## 2018-10-18 DIAGNOSIS — M25652 Stiffness of left hip, not elsewhere classified: Secondary | ICD-10-CM

## 2018-10-18 DIAGNOSIS — M542 Cervicalgia: Secondary | ICD-10-CM

## 2018-10-18 NOTE — Patient Instructions (Signed)
Access Code: [email or web address]  URL: [email or web address]  Date: 10/18/2018  Prepared by: Moshe CiproStephanie Weslynn Ke   Exercises  Supine Hamstring Stretch with Strap - 3 reps - 1 sets - 30 sec hold - 2x daily - 7x weekly  Supine ITB Stretch with Strap - 3 reps - 1 sets - 30 sec hold - 2x daily - 7x weekly  Prone Quad Stretch with Towel Roll and Strap - 3 reps - 1 sets - 30 sec hold - 2x daily - 7x weekly  Seated Upper Trapezius Stretch - 3 reps - 1 sets - 30 sec hold - 2x daily - 7x weekly  Seated Levator Scapulae Stretch - 3 reps - 1 sets - 30 sec hold - 2x daily - 7x weekly  Half Neck Circles - 10 reps - 1 sets - 1x daily - 7x weekly  Supine Suboccipital Release with Tennis Balls - 3-5 min hold - 1x daily - 7x weekly  Patient Education  Trigger Point Dry Needling

## 2018-10-18 NOTE — Therapy (Signed)
[facility] [address] [place], [place], [address] Phone: [phone]   Fax:  [phone]  Physical Therapy Treatment/Re-Evaluation  Patient Details  Name: Stacy Reed MRN: [id number] Date of Birth: 03/25/1956 Referring Provider (PT): Darnell LevelSlade Moore, MD/ Clementeen GrahamEvan Corey, MD   Encounter Date: 10/18/2018  PT End of Session - 10/18/18 1031    Visit Number  5    Number of Visits  12    Date for PT Re-Evaluation  11/29/18    Authorization Type  BCBS    PT Start Time  0847    PT Stop Time  0925    PT Time Calculation (min)  38 min    Activity Tolerance  Patient tolerated treatment well    Behavior During Therapy  [facility] for tasks assessed/performed       Past Medical History:  Diagnosis Date  . Anxiety 06/09/2018  . Hyperlipidemia 06/09/2018  . OA (osteoarthritis) of hip 08/27/2018  . Osteopenia determined by x-ray 07/05/2018   T score -2.24 June 2018  . Rheumatoid arthritis involving multiple sites with positive rheumatoid factor (HCC) 03/23/2017    History reviewed. No pertinent surgical history.  There were no vitals filed for this visit.  Subjective Assessment - 10/18/18 0850    Subjective  was sick friday and saturday.  vertigo seems better but still present.      Patient Stated Goals  improve pain, avoid surgery; wants to be able to ride horses again    Pain Score  3     Pain Location  Neck    Pain Orientation  Right;Left    Pain Descriptors / Indicators  Aching;Dull    Pain Type  Chronic pain    Pain Onset  More than a month ago    Pain Frequency  Constant    Aggravating Factors   turning head    Pain Relieving Factors  rest         [facility] - 10/18/18 0908      Assessment   Medical Diagnosis  OA of hip; neck pain; vertigo    Referring Provider (PT)  Darnell LevelSlade Moore, MD/ Clementeen GrahamEvan Corey, MD      ROM / Strength   AROM / PROM / Strength  AROM      AROM   AROM Assessment Site  Cervical    Cervical  Flexion  39    Cervical Extension  28    Cervical - Right Side Bend  17    Cervical - Left Side Bend  18    Cervical - Right Rotation  45    Cervical - Left Rotation  55      Palpation   Palpation comment  tightness and trigger points noted Rt>Lt suboccipitals; cervical paraspinals, upper trap and levator scapula         Vestibular Assessment - 10/18/18 0854      Vestibular Assessment   General Observation  no symptoms at rest      Symptom Behavior   Type of Dizziness  Imbalance   nauseous, spinning   Frequency of Dizziness  with rolling, quick head turns    Duration of Dizziness  30 sec    Aggravating Factors  Turning head quickly;Forward bending;Rolling to left    Relieving Factors  Lying supine;Head stationary;Closing eyes      Occulomotor Exam   Occulomotor Alignment  Normal    Spontaneous  Absent    Gaze-induced  Absent    Smooth Pursuits  Intact    Saccades  Intact      Vestibulo-Occular Reflex   VOR 1 Head Only (x 1 viewing)  WNL with increase in symptoms; 2/10    Comment  HIT (-) bil      Positional Testing   Dix-Hallpike  Dix-Hallpike Right;Dix-Hallpike Left    Horizontal Canal Testing  Horizontal Canal Right;Horizontal Canal Left      Dix-Hallpike Right   Dix-Hallpike Right Duration  none    Dix-Hallpike Right Symptoms  No nystagmus      Dix-Hallpike Left   Dix-Hallpike Left Duration  none    Dix-Hallpike Left Symptoms  No nystagmus      Horizontal Canal Right   Horizontal Canal Right Duration  none    Horizontal Canal Right Symptoms  Normal      Horizontal Canal Left   Horizontal Canal Left Duration  none    Horizontal Canal Left Symptoms  Normal               [facility] Adult PT Treatment/Exercise - 10/18/18 1027      Exercises   Exercises  Neck      Neck Exercises: Seated   Other Seated Exercise  cervical half circles x 10 reps      Neck Exercises: Stretches   Upper Trapezius Stretch  Right;Left;1 rep;30 seconds    Levator Stretch   Right;Left;1 rep;30 seconds    Other Neck Stretches  instructed in suboccipital release with tennis balls             PT Education - 10/18/18 1031    Education Details  neck HEP, causes of vertigo    Person(s) Educated  Patient    Methods  Explanation;Demonstration;Handout    Comprehension  Verbalized understanding;Returned demonstration          PT Long Term Goals - 10/18/18 1031      PT LONG TERM GOAL #1   Title  independent with HEP    Status  New    Target Date  11/29/18      PT LONG TERM GOAL #2   Title  FOTO score improved to </= 35% limited for improved function    Status  New    Target Date  11/29/18      PT LONG TERM GOAL #3   Title  report pain < 3/10 at end of day for improved function and sleep    Status  New    Target Date  11/29/18      PT LONG TERM GOAL #4   Title  report decreased pain with lumbar and hip ROM for improved function    Status  New    Target Date  11/29/18      PT LONG TERM GOAL #5   Title  improve cervical rotation to at least 50 degrees bil for improved ability to turn head while driving    Status  New    Target Date  11/29/18      PT LONG TERM GOAL #6   Title  report 50% improvement in vestibular symptoms with dynamic balance for improved function    Status  New    Target Date  11/29/18            Plan - 10/18/18 1040    Clinical Impression Statement  Pt presents today with new referral for neck pain and vertigo.  Negative testing for BPPV today, and feel symptoms most consistent with vestibular hypofunction at this time.  Pt also with  decreased neck ROM and active trigger points.  Will continue to benefit from PT to address deficits listed.  Anticipate plans to d/c from hip and focus on neck and vertigo.    Rehab Potential  Good    PT Frequency  2x / week    PT Duration  6 weeks    PT Treatment/Interventions  ADLs/Self Care Home Management;Cryotherapy;Electrical Stimulation;Ultrasound;Moist Heat;Iontophoresis 4mg /ml  Dexamethasone;Functional mobility training;Stair training;Gait training;Therapeutic activities;Therapeutic exercise;Patient/family education;Neuromuscular re-education;Balance training;Manual techniques;Taping;Dry needling;Passive range of motion    PT Next Visit Plan  manual therapy, review HEP and add core strengthening, give gaze stab, review neck HEP and plan for DN to neck/UT/LS    PT Home Exercise Plan  Access Code: [email or web address]    Consulted and Agree with Plan of Care  Patient       Patient will benefit from skilled therapeutic intervention in order to improve the following deficits and impairments:  Increased fascial restricitons, Increased muscle spasms, Pain, Hypomobility, Decreased range of motion, Decreased strength, Impaired flexibility  Visit Diagnosis: Pain in right hip - Plan: PT plan of care cert/re-cert  Pain in left hip - Plan: PT plan of care cert/re-cert  Stiffness of right hip, not elsewhere classified - Plan: PT plan of care cert/re-cert  Stiffness of left hip, not elsewhere classified - Plan: PT plan of care cert/re-cert  Dizziness and giddiness - Plan: PT plan of care cert/re-cert  Unsteadiness on feet - Plan: PT plan of care cert/re-cert  Cervicalgia - Plan: PT plan of care cert/re-cert     Problem List Patient Active Problem List   Diagnosis Date Noted  . OA (osteoarthritis) of hip 08/27/2018  . Osteopenia determined by x-ray 07/05/2018  . Anxiety 06/09/2018  . Hyperlipidemia 06/09/2018  . Insomnia 06/09/2018  . Long-term use of high-risk medication 03/23/2017  . Rheumatoid arthritis involving multiple sites with positive rheumatoid factor (HCC) 03/23/2017       Clarita Crane, PT, DPT 10/18/18 10:44 AM      [facility] [address] [place], [place], [address] Phone: [phone]   Fax:  [phone]  Name: Stacy Reed MRN: [id number] Date of Birth: 01/28/1956

## 2018-10-23 ENCOUNTER — Encounter: Payer: Self-pay | Admitting: Family Medicine

## 2018-10-25 ENCOUNTER — Ambulatory Visit: Payer: BLUE CROSS/BLUE SHIELD | Admitting: Physical Therapy

## 2018-10-25 ENCOUNTER — Encounter: Payer: Self-pay | Admitting: Physical Therapy

## 2018-10-25 DIAGNOSIS — M25652 Stiffness of left hip, not elsewhere classified: Secondary | ICD-10-CM

## 2018-10-25 DIAGNOSIS — R42 Dizziness and giddiness: Secondary | ICD-10-CM

## 2018-10-25 DIAGNOSIS — M25651 Stiffness of right hip, not elsewhere classified: Secondary | ICD-10-CM | POA: Diagnosis not present

## 2018-10-25 DIAGNOSIS — M25551 Pain in right hip: Secondary | ICD-10-CM | POA: Diagnosis not present

## 2018-10-25 DIAGNOSIS — R2681 Unsteadiness on feet: Secondary | ICD-10-CM

## 2018-10-25 DIAGNOSIS — M25552 Pain in left hip: Secondary | ICD-10-CM

## 2018-10-25 DIAGNOSIS — M542 Cervicalgia: Secondary | ICD-10-CM

## 2018-10-25 NOTE — Therapy (Signed)
[place] [address] [place], [place], [address] Phone: [phone]   Fax:  [phone]  Physical Therapy Treatment  Patient Details  Name: Stacy Reed MRN: [id number] Date of Birth: 12/16/1955 Referring Provider (PT): Jaynee Eagles, MD/ Lynne Leader, MD   Encounter Date: 10/25/2018  PT End of Session - 10/25/18 0757    Visit Number  6    Number of Visits  12    Date for PT Re-Evaluation  11/29/18    Authorization Type  BCBS    PT Start Time  0715    PT Stop Time  0756    PT Time Calculation (min)  41 min    Activity Tolerance  Patient tolerated treatment well    Behavior During Therapy  [facility] for tasks assessed/performed       Past Medical History:  Diagnosis Date  . Anxiety 06/09/2018  . Hyperlipidemia 06/09/2018  . OA (osteoarthritis) of hip 08/27/2018  . Osteopenia determined by x-ray 07/05/2018   T score -2.24 June 2018  . Rheumatoid arthritis involving multiple sites with positive rheumatoid factor ([place]) 03/23/2017    History reviewed. No pertinent surgical history.  There were no vitals filed for this visit.  Subjective Assessment - 10/25/18 0713    Subjective  doing well today.  reports a few good days "full body wise."    Patient Stated Goals  improve pain, avoid surgery; wants to be able to ride horses again    Currently in Pain?  No/denies    Pain Onset  More than a month ago         [facility] PT Assessment - 10/25/18 0756      Assessment   Medical Diagnosis  OA of hip; neck pain; vertigo    Referring Provider (PT)  Jaynee Eagles, MD/ Lynne Leader, MD      Observation/Other Assessments   Focus on Therapeutic Outcomes (FOTO)   66 (34% limited)                   [facility] Adult PT Treatment/Exercise - 10/25/18 0717      Exercises   Exercises  Neck;Knee/Hip      Neck Exercises: Seated   Shoulder Rolls  Backwards;10 reps    Other Seated Exercise  cervical half circles x 10 reps    Other  Seated Exercise  scapular retraction x 10 reps      Knee/Hip Exercises: Aerobic   Nustep  L5 x 6 min      Manual Therapy   Manual Therapy  Soft tissue mobilization    Manual therapy comments  skilled palpation and monitoring of soft tissue during DN    Soft tissue mobilization  STM to cervical paraspinals, upper trap, levator scapula and suboccipital release      Neck Exercises: Stretches   Upper Trapezius Stretch  Right;Left;2 reps;30 seconds    Levator Stretch  Right;Left;2 reps;30 seconds       Trigger Point Dry Needling - 10/25/18 0749    Consent Given?  Yes    Education Handout Provided  Yes    Muscles Treated Upper Body  Longissimus;Suboccipitals muscle group    SubOccipitals Response  Twitch response elicited;Palpable increased muscle length    Longissimus Response  Twitch response elicited;Palpable increased muscle length   cervical paraspinals and multifidi               PT Long Term Goals - 10/25/18 0757      PT LONG TERM  GOAL #1   Title  independent with HEP    Status  New    Target Date  11/29/18      PT LONG TERM GOAL #2   Title  FOTO score improved to </= 35% limited for improved function    Status  Achieved      PT LONG TERM GOAL #3   Title  report pain < 3/10 at end of day for improved function and sleep    Status  On-going    Target Date  11/29/18      PT LONG TERM GOAL #4   Title  report decreased pain with lumbar and hip ROM for improved function    Status  On-going    Target Date  11/29/18      PT LONG TERM GOAL #5   Title  improve cervical rotation to at least 50 degrees bil for improved ability to turn head while driving    Status  On-going    Target Date  11/29/18      PT LONG TERM GOAL #6   Title  report 50% improvement in vestibular symptoms with dynamic balance for improved function    Status  On-going    Target Date  11/29/18            Plan - 10/25/18 0758    Clinical Impression Statement  Pt has met FOTO goal with  improved function overall with hip pain.  Will plan to continue to update HEP PRN and focus on neck pain.  Progressing well with positive response to DN and manual therapy today.    Rehab Potential  Good    PT Frequency  2x / week    PT Duration  6 weeks    PT Treatment/Interventions  ADLs/Self Care Home Management;Cryotherapy;Electrical Stimulation;Ultrasound;Moist Heat;Iontophoresis 44m/ml Dexamethasone;Functional mobility training;Stair training;Gait training;Therapeutic activities;Therapeutic exercise;Patient/family education;Neuromuscular re-education;Balance training;Manual techniques;Taping;Dry needling;Passive range of motion    PT Next Visit Plan  manual therapy, review HEP and add core strengthening, give gaze stab, review neck HEP and plan for DN to neck/UT/LS    PT Home Exercise Plan  Access Code: [email or web address]   Consulted and Agree with Plan of Care  Patient       Patient will benefit from skilled therapeutic intervention in order to improve the following deficits and impairments:  Increased fascial restricitons, Increased muscle spasms, Pain, Hypomobility, Decreased range of motion, Decreased strength, Impaired flexibility  Visit Diagnosis: Pain in right hip  Pain in left hip  Stiffness of right hip, not elsewhere classified  Stiffness of left hip, not elsewhere classified  Dizziness and giddiness  Unsteadiness on feet  Cervicalgia     Problem List Patient Active Problem List   Diagnosis Date Noted  . OA (osteoarthritis) of hip 08/27/2018  . Osteopenia determined by x-ray 07/05/2018  . Anxiety 06/09/2018  . Hyperlipidemia 06/09/2018  . Insomnia 06/09/2018  . Long-term use of high-risk medication 03/23/2017  . Rheumatoid arthritis involving multiple sites with positive rheumatoid factor ([place] 03/23/2017      SLaureen Abrahams PT, DPT 10/25/18 9:15 AM    [facility] [address]Phone: [phone]  Fax:  [phone] Name: TMichol EmoryMRN: [id number] of Birth: [birth year]

## 2018-10-29 ENCOUNTER — Encounter: Payer: Self-pay | Admitting: Physical Therapy

## 2018-10-29 ENCOUNTER — Ambulatory Visit: Payer: BLUE CROSS/BLUE SHIELD | Admitting: Physical Therapy

## 2018-10-29 DIAGNOSIS — M542 Cervicalgia: Secondary | ICD-10-CM | POA: Diagnosis not present

## 2018-10-29 DIAGNOSIS — M25651 Stiffness of right hip, not elsewhere classified: Secondary | ICD-10-CM

## 2018-10-29 DIAGNOSIS — R42 Dizziness and giddiness: Secondary | ICD-10-CM

## 2018-10-29 DIAGNOSIS — M25551 Pain in right hip: Secondary | ICD-10-CM

## 2018-10-29 DIAGNOSIS — M25652 Stiffness of left hip, not elsewhere classified: Secondary | ICD-10-CM

## 2018-10-29 DIAGNOSIS — M25552 Pain in left hip: Secondary | ICD-10-CM | POA: Diagnosis not present

## 2018-10-29 DIAGNOSIS — R2681 Unsteadiness on feet: Secondary | ICD-10-CM

## 2018-10-29 NOTE — Therapy (Signed)
[facility] [place] [address] [place], [place], [address] Phone: [phone]   Fax:  [phone]  Physical Therapy Treatment  Patient Details  Name: Stacy Reed MRN: [id number] Date of Birth: 03-17-1956 Referring Provider (PT): Darnell Level, MD/ Clementeen Graham, MD   Encounter Date: 10/29/2018  PT End of Session - 10/29/18 1013    Visit Number  7    Number of Visits  12    Date for PT Re-Evaluation  11/29/18    Authorization Type  BCBS    PT Start Time  0930    PT Stop Time  1020    PT Time Calculation (min)  50 min    Activity Tolerance  Patient tolerated treatment well    Behavior During Therapy  [facility] for tasks assessed/performed       Past Medical History:  Diagnosis Date  . Anxiety 06/09/2018  . Hyperlipidemia 06/09/2018  . OA (osteoarthritis) of hip 08/27/2018  . Osteopenia determined by x-ray 07/05/2018   T score -2.24 June 2018  . Rheumatoid arthritis involving multiple sites with positive rheumatoid factor (HCC) 03/23/2017    History reviewed. No pertinent surgical history.  There were no vitals filed for this visit.  Subjective Assessment - 10/29/18 0937    Subjective  felt much better after DN/manual therapy then read last night for about 1 hour and neck pain has returned.  Pt feels it's better but still limited.    Patient Stated Goals  improve pain, avoid surgery; wants to be able to ride horses again    Currently in Pain?  No/denies    Pain Onset  More than a month ago                       [facility] Adult PT Treatment/Exercise - 10/29/18 0938      Knee/Hip Exercises: Aerobic   Nustep  L5 x 6 min      Moist Heat Therapy   Number Minutes Moist Heat  10 Minutes    Moist Heat Location  Cervical      Manual Therapy   Manual Therapy  Soft tissue mobilization    Manual therapy comments  skilled palpation and monitoring of soft tissue during DN    Soft tissue mobilization  STM to cervical  paraspinals, upper trap, levator scapula and suboccipital release       Trigger Point Dry Needling - 10/29/18 1011    Consent Given?  No    Muscles Treated Upper Body  Longissimus;Suboccipitals muscle group;Upper trapezius    Upper Trapezius Response  Twitch reponse elicited;Palpable increased muscle length    SubOccipitals Response  Twitch response elicited;Palpable increased muscle length    Longissimus Response  Twitch response elicited;Palpable increased muscle length   cervical paraspinals               PT Long Term Goals - 10/25/18 0757      PT LONG TERM GOAL #1   Title  independent with HEP    Status  New    Target Date  11/29/18      PT LONG TERM GOAL #2   Title  FOTO score improved to </= 35% limited for improved function    Status  Achieved      PT LONG TERM GOAL #3   Title  report pain < 3/10 at end of day for improved function and sleep    Status  On-going    Target Date  11/29/18  PT LONG TERM GOAL #4   Title  report decreased pain with lumbar and hip ROM for improved function    Status  On-going    Target Date  11/29/18      PT LONG TERM GOAL #5   Title  improve cervical rotation to at least 50 degrees bil for improved ability to turn head while driving    Status  On-going    Target Date  11/29/18      PT LONG TERM GOAL #6   Title  report 50% improvement in vestibular symptoms with dynamic balance for improved function    Status  On-going    Target Date  11/29/18            Plan - 10/29/18 1013    Clinical Impression Statement  Pt with positive response to DN and manual therapy last session with increase in pain following reading for ~ 1 hour in bed.  Discussed using device to help prop book, or to take breaks when reading to help wiht symptoms.  Pt verbalized understanding.  Positive response to DN and PT at this time, and progressing towards goals.    Rehab Potential  Good    PT Frequency  2x / week    PT Duration  6 weeks    PT  Treatment/Interventions  ADLs/Self Care Home Management;Cryotherapy;Electrical Stimulation;Ultrasound;Moist Heat;Iontophoresis 4mg /ml Dexamethasone;Functional mobility training;Stair training;Gait training;Therapeutic activities;Therapeutic exercise;Patient/family education;Neuromuscular re-education;Balance training;Manual techniques;Taping;Dry needling;Passive range of motion    PT Next Visit Plan  manual therapy, review HEP and add core strengthening, give gaze stab, review neck HEP and plan for DN to neck/UT/LS    PT Home Exercise Plan  Access Code: [email or web address]    Consulted and Agree with Plan of Care  Patient       Patient will benefit from skilled therapeutic intervention in order to improve the following deficits and impairments:  Increased fascial restricitons, Increased muscle spasms, Pain, Hypomobility, Decreased range of motion, Decreased strength, Impaired flexibility  Visit Diagnosis: Cervicalgia  Pain in right hip  Pain in left hip  Stiffness of right hip, not elsewhere classified  Stiffness of left hip, not elsewhere classified  Dizziness and giddiness  Unsteadiness on feet     Problem List Patient Active Problem List   Diagnosis Date Noted  . OA (osteoarthritis) of hip 08/27/2018  . Osteopenia determined by x-ray 07/05/2018  . Anxiety 06/09/2018  . Hyperlipidemia 06/09/2018  . Insomnia 06/09/2018  . Long-term use of high-risk medication 03/23/2017  . Rheumatoid arthritis involving multiple sites with positive rheumatoid factor (HCC) 03/23/2017      Clarita CraneStephanie F Kammy Klett, PT, DPT 10/29/18 10:16 AM     [facility] [address] [place], [place], [address] Phone: [phone]   Fax:  [phone]  Name: Lanae Boastracy Freid MRN: [id number] Date of Birth: March 20, 1956

## 2018-11-02 ENCOUNTER — Ambulatory Visit: Payer: BLUE CROSS/BLUE SHIELD | Admitting: Physical Therapy

## 2018-11-02 ENCOUNTER — Encounter: Payer: Self-pay | Admitting: Physical Therapy

## 2018-11-02 DIAGNOSIS — M25552 Pain in left hip: Secondary | ICD-10-CM

## 2018-11-02 DIAGNOSIS — M542 Cervicalgia: Secondary | ICD-10-CM

## 2018-11-02 DIAGNOSIS — M25551 Pain in right hip: Secondary | ICD-10-CM

## 2018-11-02 DIAGNOSIS — R42 Dizziness and giddiness: Secondary | ICD-10-CM | POA: Diagnosis not present

## 2018-11-02 DIAGNOSIS — M25651 Stiffness of right hip, not elsewhere classified: Secondary | ICD-10-CM

## 2018-11-02 DIAGNOSIS — M25652 Stiffness of left hip, not elsewhere classified: Secondary | ICD-10-CM | POA: Diagnosis not present

## 2018-11-02 DIAGNOSIS — R2681 Unsteadiness on feet: Secondary | ICD-10-CM

## 2018-11-02 NOTE — Patient Instructions (Signed)
  [employer]ane (~$25-30 on [employer]); can get similar item at [employer] in exercise section    TENS UNIT  This is helpful for muscle pain and spasm.   Search and Purchase a TENS 7000 2nd edition at www.tenspros.com or www.amazon.com  (It should be less than $30)     TENS unit instructions:   Do not shower or bathe with the unit on  Turn the unit off before removing electrodes or batteries  If the electrodes lose stickiness add a drop of water to the electrodes after they are disconnected from the unit and place on plastic sheet. If you continued to have difficulty, call the TENS unit company to purchase more electrodes.  Do not apply lotion on the skin area prior to use. Make sure the skin is clean and dry as this will help prolong the life of the electrodes.  After use, always check skin for unusual red areas, rash or other skin difficulties. If there are any skin problems, does not apply electrodes to the same area.  Never remove the electrodes from the unit by pulling the wires.  Do not use the TENS unit or electrodes other than as directed.  Do not change electrode placement without consulting your therapist or physician.  Keep 2 fingers with between each electrode.  

## 2018-11-02 NOTE — Therapy (Addendum)
[place], [place], [address] Phone: [phone]   Fax:  [phone]  Physical Therapy Treatment/Discharge  Patient Details  Name: Stacy Reed Payment MRN: [id number] Date of Birth: [birth year] Referring Provider (PT): Jaynee Eagles, MD/ Lynne Leader, MD   Encounter Date: 11/02/2018  PT End of Session - 11/02/18 1025    Visit Number  8    Number of Visits  12    Date for PT Re-Evaluation  11/29/18    Authorization Type  [employer]    PT Start Time  0930    PT Stop Time  1025    PT Time Calculation (min)  55 min    Activity Tolerance  Patient tolerated treatment well    Behavior During Therapy  [facility] for tasks assessed/performed       Past Medical History:  Diagnosis Date  . Anxiety 06/09/2018  . Hyperlipidemia 06/09/2018  . OA (osteoarthritis) of hip 08/27/2018  . Osteopenia determined by x-ray 07/05/2018   T score -2.24 June 2018  . Rheumatoid arthritis involving multiple sites with positive rheumatoid factor ([place]) 03/23/2017    History reviewed. No pertinent surgical history.  There were no vitals filed for this visit.  Subjective Assessment - 11/02/18 0932    Subjective  feels much better in neck, has more motion and decreased tightness.    Patient Stated Goals  improve pain, avoid surgery; wants to be able to ride horses again    Currently in Pain?  No/denies         [facility] PT Assessment - 11/02/18 1025      Assessment   Medical Diagnosis  OA of hip; neck pain; vertigo    Referring Provider (PT)  Jaynee Eagles, MD/ Lynne Leader, MD      AROM   Cervical - Right Rotation  45    Cervical - Left Rotation  47                   [facility] Adult PT Treatment/Exercise - 11/02/18 0933      Self-Care   Other Self-Care Comments   educated in use of massage blocks and theracane for trigger point release.  provided purchase information for theracane and tens unit      Exercises   Exercises  Neck       Neck Exercises: Machines for Strengthening   UBE (Upper Arm Bike)  L2 x 4 min (2 min each direction)      Neck Exercises: Theraband   Shoulder External Rotation  10 reps;Red    Horizontal ABduction  10 reps;Red   supine   Other Theraband Exercises  overhead pull x 10 with red theraband      Neck Exercises: Supine   Neck Retraction  10 reps;5 secs      Manual Therapy   Manual Therapy  Soft tissue mobilization;Myofascial release    Soft tissue mobilization  STM to cervical paraspinals, upper trap, levator scapula and suboccipital release    Myofascial Release  trigger point release to levator scapula and upper traps bil      Neck Exercises: Stretches   Upper Trapezius Stretch  Right;Left;2 reps;30 seconds    Levator Stretch  Right;Left;2 reps;30 seconds             PT Education - 11/02/18 1024    Education Details  theracane, TENS unit    Person(s) Educated  Patient    Methods  Explanation;Handout    Comprehension  Verbalized understanding  PT Long Term Goals - 10/25/18 0757      PT LONG TERM GOAL #1   Title  independent with HEP    Status  New    Target Date  11/29/18      PT LONG TERM GOAL #2   Title  FOTO score improved to </= 35% limited for improved function    Status  Achieved      PT LONG TERM GOAL #3   Title  report pain < 3/10 at end of day for improved function and sleep    Status  On-going    Target Date  11/29/18      PT LONG TERM GOAL #4   Title  report decreased pain with lumbar and hip ROM for improved function    Status  On-going    Target Date  11/29/18      PT LONG TERM GOAL #5   Title  improve cervical rotation to at least 50 degrees bil for improved ability to turn head while driving    Status  On-going    Target Date  11/29/18      PT LONG TERM GOAL #6   Title  report 50% improvement in vestibular symptoms with dynamic balance for improved function    Status  On-going    Target Date  11/29/18            Plan -  11/02/18 1027    Clinical Impression Statement  Pt with no significant change in cervical rotation ROM, but measured sitting today compared to supine initially.  Pt feels ROM has improved with decreased tightness.  Will continue to benefit from PT to maximize function.    Rehab Potential  Good    PT Frequency  2x / week    PT Duration  6 weeks    PT Treatment/Interventions  ADLs/Self Care Home Management;Cryotherapy;Electrical Stimulation;Ultrasound;Moist Heat;Iontophoresis 24m/ml Dexamethasone;Functional mobility training;Stair training;Gait training;Therapeutic activities;Therapeutic exercise;Patient/family education;Neuromuscular re-education;Balance training;Manual techniques;Taping;Dry needling;Passive range of motion    PT Next Visit Plan  manual therapy, review HEP and add core strengthening, give gaze stab, review neck HEP and plan for DN to neck/UT/LS    PT Home Exercise Plan  Access Code: [email or web address]   Consulted and Agree with Plan of Care  Patient       Patient will benefit from skilled therapeutic intervention in order to improve the following deficits and impairments:  Increased fascial restricitons, Increased muscle spasms, Pain, Hypomobility, Decreased range of motion, Decreased strength, Impaired flexibility  Visit Diagnosis: Cervicalgia  Dizziness and giddiness  Unsteadiness on feet  Stiffness of left hip, not elsewhere classified  Stiffness of right hip, not elsewhere classified  Pain in left hip  Pain in right hip     Problem List Patient Active Problem List   Diagnosis Date Noted  . OA (osteoarthritis) of hip 08/27/2018  . Osteopenia determined by x-ray 07/05/2018  . Anxiety 06/09/2018  . Hyperlipidemia 06/09/2018  . Insomnia 06/09/2018  . Long-term use of high-risk medication 03/23/2017  . Rheumatoid arthritis involving multiple sites with positive rheumatoid factor ([place] 03/23/2017      SLaureen Abrahams PT, DPT 11/02/18 10:29 AM     [facility] [address] [address]Phone: [phone]  Fax:  [phone] Name: TDennisha MouserMRN: [id number] of Birth: [birth year]    PHYSICAL THERAPY DISCHARGE SUMMARY  Visits from Start of Care: 8  Current functional level related to goals /  functional outcomes: See above   Remaining deficits: See above   Education / Equipment: HEP  Plan: Patient agrees to discharge.  Patient goals were partially met. Patient is being discharged due to not returning since the last visit.  ?????     Laureen Abrahams, PT, DPT 01/25/19 2:52 PM  [facility] [place], [place] [address]  [phone] (office) [phone] (fax)

## 2018-11-26 ENCOUNTER — Ambulatory Visit: Payer: BLUE CROSS/BLUE SHIELD

## 2018-11-29 ENCOUNTER — Encounter: Payer: BLUE CROSS/BLUE SHIELD | Admitting: Family Medicine

## 2018-12-16 IMAGING — DX DG HIP (WITH OR WITHOUT PELVIS) 2V BILAT
5 series · 5 of 5 positions shown · non-contrast
Comparison: None.

CLINICAL DATA: Bilateral hip pain.  No injury.

EXAM:
DG HIP (WITH OR WITHOUT PELVIS) 2V BILAT

[pelvis ap]
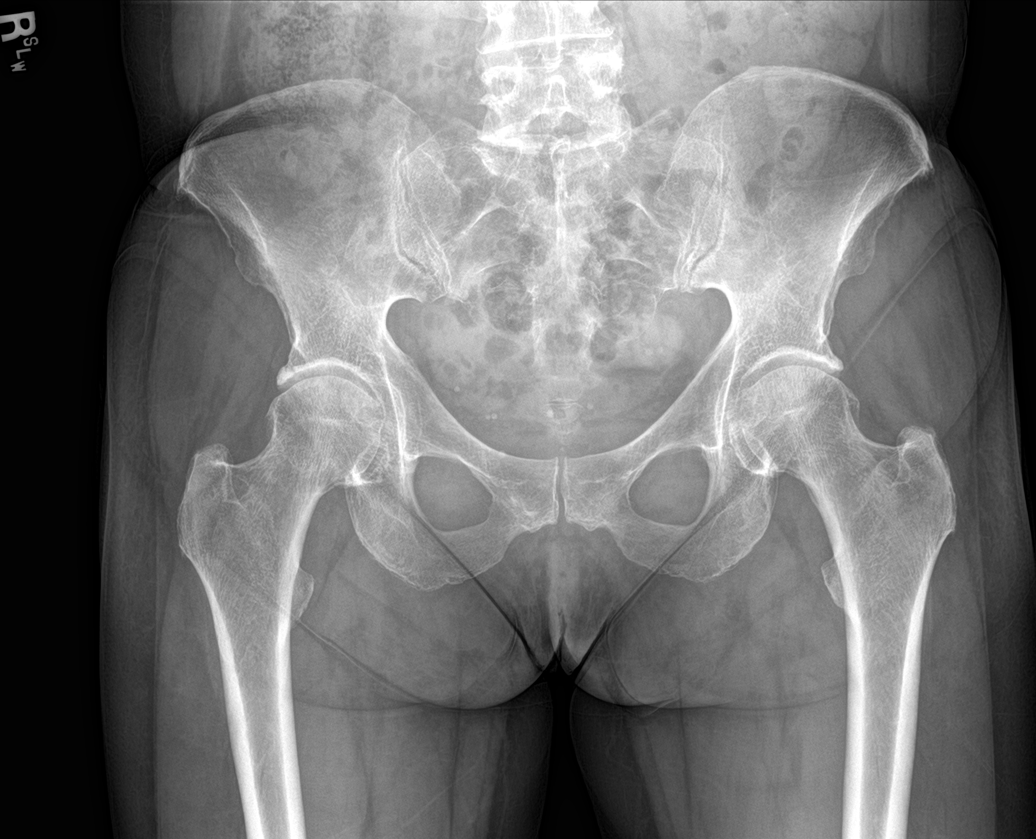

[hip ap (1 of 2)]
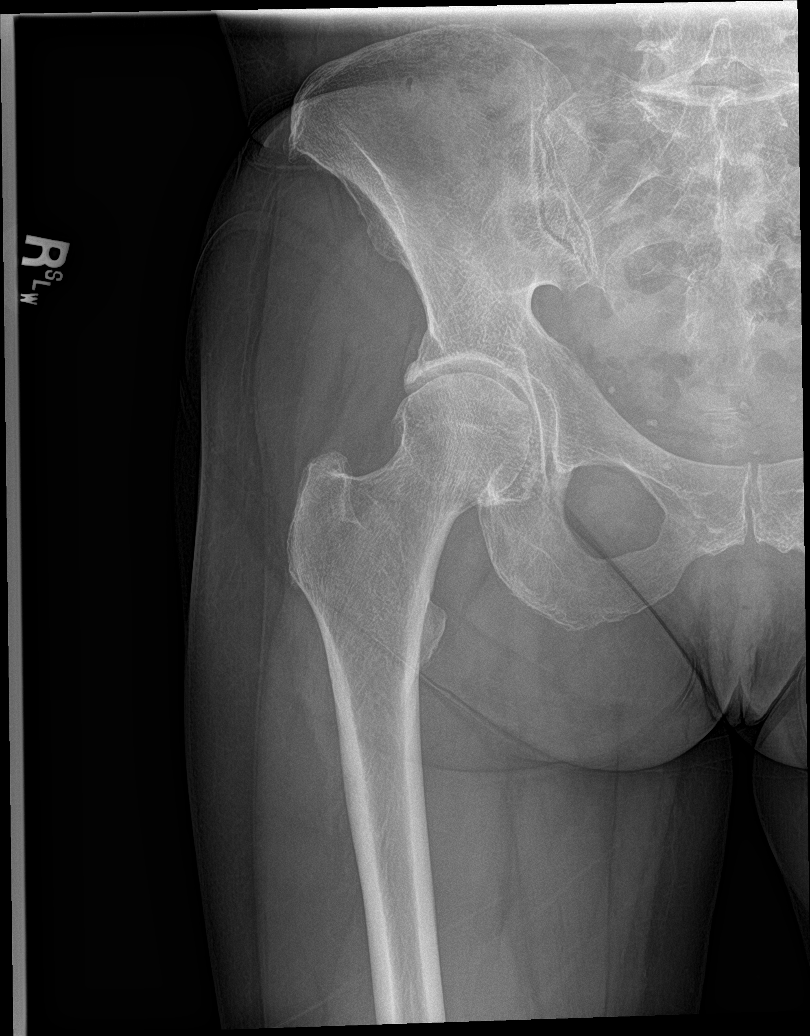

[hip lat (1 of 2)]
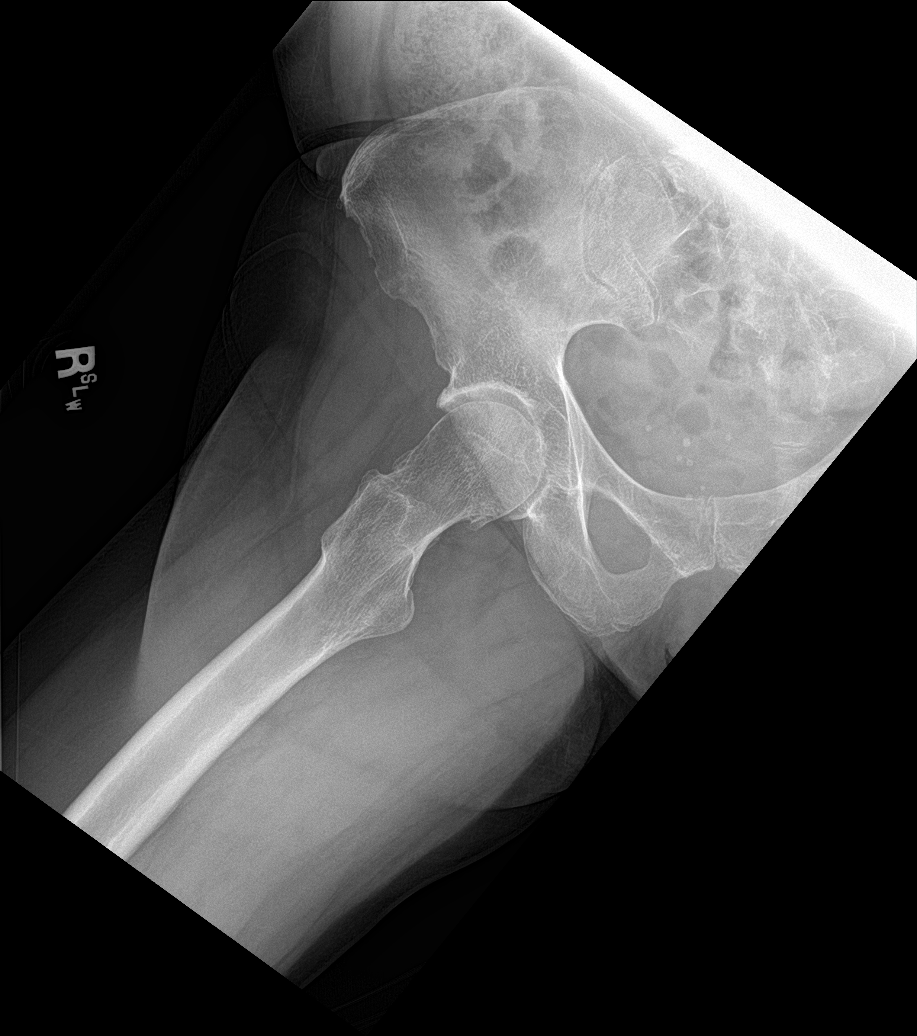

[hip ap (2 of 2)]
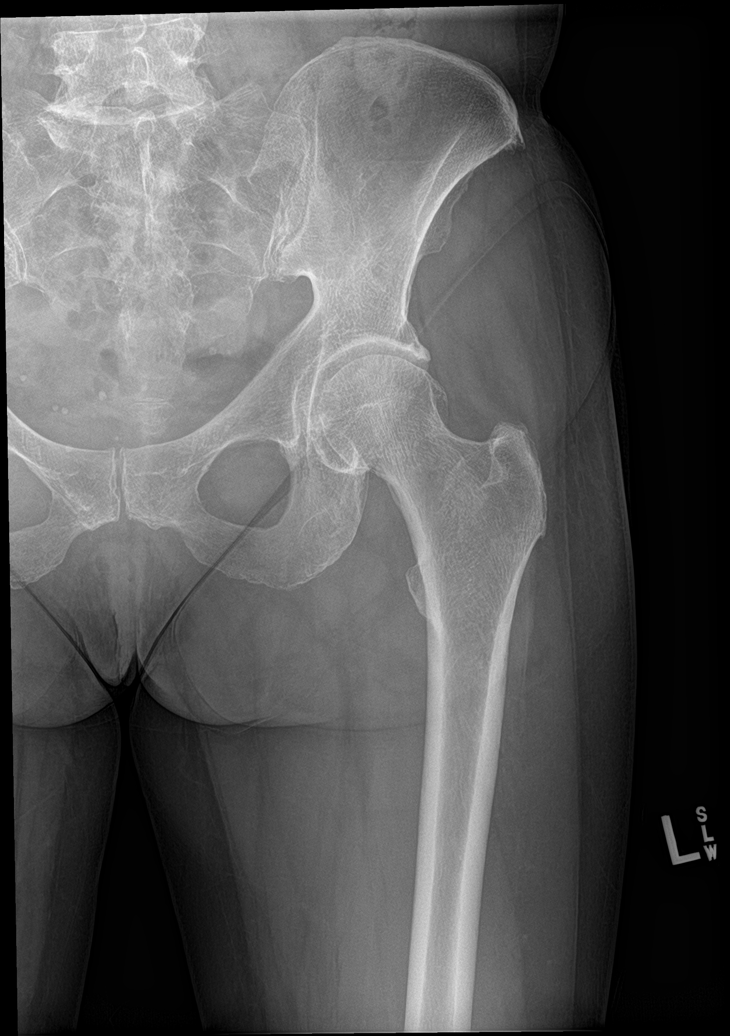

[hip lat (2 of 2)]
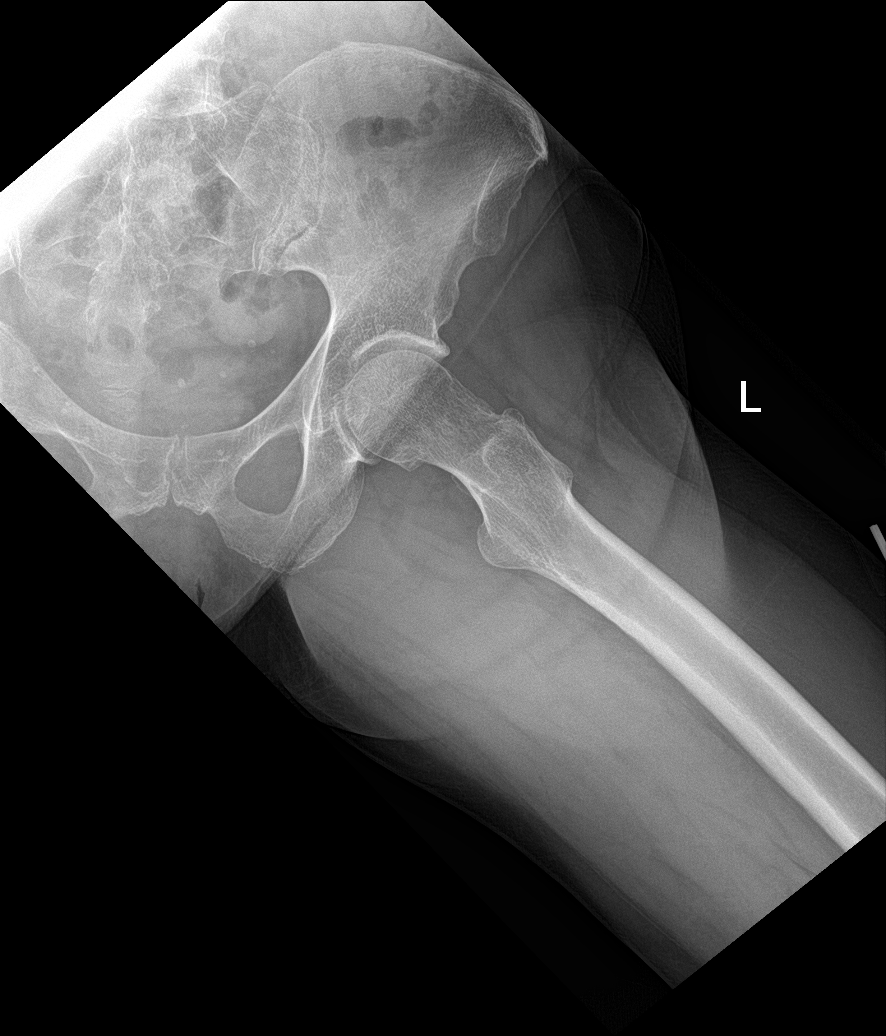

[5 of 5 positions shown; findings below may reference images not displayed]

FINDINGS: Degenerative changes in the hips bilaterally with joint space
narrowing and spurring, symmetric. SI joints are symmetric and
unremarkable. No acute bony abnormality. Specifically, no fracture,
subluxation, or dislocation.
IMPRESSION: Moderate degenerative changes in the hips bilaterally. No acute bony
abnormality.

## 2022-11-15 ENCOUNTER — Ambulatory Visit (HOSPITAL_COMMUNITY): Admit: 2022-11-15 | Discharge status: Against Medical Advice | Payer: BLUE CROSS/BLUE SHIELD
